# Patient Record
Sex: Male | Born: 1950 | Race: White | Hispanic: No | Marital: Married | Smoking: Former smoker
Health system: Southern US, Community
[De-identification: ages and names within clinical notes are randomized; demographics above are authoritative.]

## PROBLEM LIST (undated history)

## (undated) DIAGNOSIS — K219 Gastro-esophageal reflux disease without esophagitis: Secondary | ICD-10-CM

## (undated) DIAGNOSIS — K635 Polyp of colon: Secondary | ICD-10-CM

## (undated) DIAGNOSIS — M8949 Other hypertrophic osteoarthropathy, multiple sites: Secondary | ICD-10-CM

## (undated) DIAGNOSIS — M150 Primary generalized (osteo)arthritis: Secondary | ICD-10-CM

## (undated) DIAGNOSIS — M159 Polyosteoarthritis, unspecified: Secondary | ICD-10-CM

## (undated) DIAGNOSIS — J301 Allergic rhinitis due to pollen: Secondary | ICD-10-CM

## (undated) DIAGNOSIS — G4730 Sleep apnea, unspecified: Secondary | ICD-10-CM

## (undated) DIAGNOSIS — E7800 Pure hypercholesterolemia, unspecified: Secondary | ICD-10-CM

## (undated) DIAGNOSIS — T7840XA Allergy, unspecified, initial encounter: Secondary | ICD-10-CM

## (undated) HISTORY — DX: Other hypertrophic osteoarthropathy, multiple sites: M89.49

## (undated) HISTORY — DX: Pure hypercholesterolemia, unspecified: E78.00

## (undated) HISTORY — DX: Polyp of colon: K63.5

## (undated) HISTORY — DX: Gastro-esophageal reflux disease without esophagitis: K21.9

## (undated) HISTORY — DX: Allergy, unspecified, initial encounter: T78.40XA

## (undated) HISTORY — DX: Sleep apnea, unspecified: G47.30

## (undated) HISTORY — PX: MEDIAL COLLATERAL LIGAMENT REPAIR, KNEE: SHX2019

## (undated) HISTORY — DX: Polyosteoarthritis, unspecified: M15.9

## (undated) HISTORY — PX: APPENDECTOMY: SHX54

## (undated) HISTORY — DX: Primary generalized (osteo)arthritis: M15.0

## (undated) HISTORY — DX: Allergic rhinitis due to pollen: J30.1

---
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
## 1998-09-08 ENCOUNTER — Ambulatory Visit: Admission: RE | Admit: 1998-09-08 | Discharge: 1998-09-08 | Payer: Self-pay | Admitting: Otolaryngology

## 2004-09-16 ENCOUNTER — Encounter: Admission: RE | Admit: 2004-09-16 | Discharge: 2004-09-16 | Payer: Self-pay | Admitting: Internal Medicine

## 2005-03-22 ENCOUNTER — Encounter: Admission: RE | Admit: 2005-03-22 | Discharge: 2005-06-20 | Payer: Self-pay | Admitting: Internal Medicine

## 2005-05-10 ENCOUNTER — Encounter: Admission: RE | Admit: 2005-05-10 | Discharge: 2005-05-10 | Payer: Self-pay | Admitting: Internal Medicine

## 2005-06-29 HISTORY — PX: OTHER SURGICAL HISTORY: SHX169

## 2007-10-23 ENCOUNTER — Encounter: Admission: RE | Admit: 2007-10-23 | Discharge: 2008-01-21 | Payer: Self-pay | Admitting: Internal Medicine

## 2009-01-09 ENCOUNTER — Ambulatory Visit (HOSPITAL_BASED_OUTPATIENT_CLINIC_OR_DEPARTMENT_OTHER): Admission: RE | Admit: 2009-01-09 | Discharge: 2009-01-09 | Payer: Self-pay | Admitting: Orthopedic Surgery

## 2009-10-15 ENCOUNTER — Ambulatory Visit (HOSPITAL_BASED_OUTPATIENT_CLINIC_OR_DEPARTMENT_OTHER): Admission: RE | Admit: 2009-10-15 | Discharge: 2009-10-15 | Payer: Self-pay | Admitting: Orthopedic Surgery

## 2009-12-11 ENCOUNTER — Encounter: Admission: RE | Admit: 2009-12-11 | Discharge: 2009-12-11 | Payer: Self-pay | Admitting: Neurological Surgery

## 2011-03-03 LAB — CULTURE, FUNGUS WITHOUT SMEAR

## 2011-03-03 LAB — KOH PREP: KOH Prep: NONE SEEN

## 2011-03-03 LAB — ANAEROBIC CULTURE

## 2011-03-03 LAB — WOUND CULTURE

## 2011-03-03 LAB — AFB CULTURE WITH SMEAR (NOT AT ARMC): Acid Fast Smear: NONE SEEN

## 2011-03-16 LAB — TISSUE CULTURE: Gram Stain: NONE SEEN

## 2011-03-16 LAB — CULTURE, FUNGUS WITHOUT SMEAR

## 2011-03-16 LAB — ANAEROBIC CULTURE: Gram Stain: NONE SEEN

## 2011-04-13 NOTE — Op Note (Signed)
Eugene Mcdaniel, Eugene Mcdaniel                ACCOUNT NO.:  [id number]   MEDICAL RECORD NO.:  [id number]          PATIENT TYPE:  AMB   LOCATION:  DSC                          FACILITY:  [facility]   PHYSICIAN:  Katy Fitch. Sypher, M.D. 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   OPERATIONS:  1. Arthrotomy, left long finger distal interphalangeal joint with      debridement of loose bodies and marginal osteophytes at base of      distal phalanx and head of middle phalanx.  2. Partial resection of radial nail plate and debridement of radial      nail wall with aerobic and anaerobic culture, left long finger      nail.   OPERATIONS:  Katy Fitch. Sypher, MD   ASSISTANT:  Marveen Reeks Dasnoit, PA-C   ANESTHESIA:  A 2% lidocaine, metacarpal head level block; left long  finger.  This was performed in the minor operating room setting.   CULTURES:  Aerobic and anaerobic culture from nail fold and nail wall of  subungual debris.   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  He was referred by Dr. Earl Gala for  evaluation and management of a chronically deformed right long  fingernail with degenerative arthritis of the distal interphalangeal  joint and signs of possible mucoid cyst drainage beneath the nail fold.   CLINICAL EXAMINATION:  On December 25, 2008, revealed signs of probable  chronic subungual and periungual infection  and a probable mucoid cyst  involving the distal interphalangeal joint.   We advised Eugene Mcdaniel to proceed with debridement of his nail wall,  partial nail plate resection, and DIP joint debridement.  At this time,  he was brought to the operating room at the [facility]   PROCEDURE:  Eugene Mcdaniel was met at the [facility] in the  minor operating room.  After informed consent, we proceeded to place a  2% lidocaine digital block at metacarpal head level.  His x-rays were  reviewed.  He has advanced degenerative change at his left long finger  DIP joint as well as the PIP and MP joints.  When anesthesia was  satisfactory, the arm and hand were prepped with Betadine soap solution  and sterilely draped with sterile towels.   The procedure commenced with removal of the radial 3 mm of the nail  plate with undermining with a Therapist, nutritional and scissors dissection.  The nail plate and debris beneath the nail fold were curetted with a  microcurette and cultures were sent for aerobic and  anaerobic growth.  There was no sign of mucoid cyst drainage beneath the nail fold nor  proximally along the radial nail wall.  This was thoroughly debrided and  dressed open.  Attention directed to the distal interphalangeal joint.  With clean instruments, lateral incisions were fashioned between the  terminal extensor tendon slip and the radial ulnar collateral ligaments.  A capsular resection was performed between the terminal extensor tendon  slip and the radial collateral ligament and likewise, the ulnar  collateral ligament.  A microcurette was used to remove the marginal  osteophytes followed by debridement of the joint and irrigation with a  blunt needle with sterile saline.  Debris was removed from the joint.  The capsular tissues were mucinous on the radial dorsal aspect of the  joint suggesting a mucoid cyst was forming.   After completion of debridement, the wounds were repaired  with simple  suture of 5-0 nylon.   Eugene Mcdaniel tolerated surgery and anesthesia well.  He was transferred to  recovery with stable signs.   He will be discharged home to the care of his family.  Our plan is to  have him return for followup evaluation in approximately 1 week with a  scheduled appointment on January 15, 2009.      Katy Fitch Sypher, M.D.  Electronically Signed     RVS/MEDQ  D:  01/23/2009  T:  01/24/2009  Job:  [id number]   cc:   Theressa Millard, M.D.

## 2015-05-09 DIAGNOSIS — M25552 Pain in left hip: Secondary | ICD-10-CM | POA: Diagnosis not present

## 2015-05-21 DIAGNOSIS — M791 Myalgia: Secondary | ICD-10-CM | POA: Diagnosis not present

## 2015-06-04 DIAGNOSIS — M47817 Spondylosis without myelopathy or radiculopathy, lumbosacral region: Secondary | ICD-10-CM | POA: Diagnosis not present

## 2015-06-04 DIAGNOSIS — M5136 Other intervertebral disc degeneration, lumbar region: Secondary | ICD-10-CM | POA: Diagnosis not present

## 2015-06-12 DIAGNOSIS — M1812 Unilateral primary osteoarthritis of first carpometacarpal joint, left hand: Secondary | ICD-10-CM | POA: Diagnosis not present

## 2015-06-17 DIAGNOSIS — M791 Myalgia: Secondary | ICD-10-CM | POA: Diagnosis not present

## 2015-06-17 DIAGNOSIS — M1612 Unilateral primary osteoarthritis, left hip: Secondary | ICD-10-CM | POA: Diagnosis not present

## 2015-06-17 DIAGNOSIS — M25552 Pain in left hip: Secondary | ICD-10-CM | POA: Diagnosis not present

## 2015-06-17 DIAGNOSIS — M4806 Spinal stenosis, lumbar region: Secondary | ICD-10-CM | POA: Diagnosis not present

## 2015-08-13 DIAGNOSIS — G4733 Obstructive sleep apnea (adult) (pediatric): Secondary | ICD-10-CM | POA: Diagnosis not present

## 2015-09-02 DIAGNOSIS — R972 Elevated prostate specific antigen [PSA]: Secondary | ICD-10-CM | POA: Diagnosis not present

## 2015-09-02 DIAGNOSIS — N138 Other obstructive and reflux uropathy: Secondary | ICD-10-CM | POA: Diagnosis not present

## 2015-09-02 DIAGNOSIS — R350 Frequency of micturition: Secondary | ICD-10-CM | POA: Diagnosis not present

## 2015-09-02 DIAGNOSIS — N401 Enlarged prostate with lower urinary tract symptoms: Secondary | ICD-10-CM | POA: Diagnosis not present

## 2015-10-21 DIAGNOSIS — M7522 Bicipital tendinitis, left shoulder: Secondary | ICD-10-CM | POA: Diagnosis not present

## 2015-10-21 DIAGNOSIS — M7581 Other shoulder lesions, right shoulder: Secondary | ICD-10-CM | POA: Insufficient documentation

## 2015-10-21 DIAGNOSIS — M76891 Other specified enthesopathies of right lower limb, excluding foot: Secondary | ICD-10-CM | POA: Insufficient documentation

## 2015-10-21 DIAGNOSIS — Z87891 Personal history of nicotine dependence: Secondary | ICD-10-CM | POA: Diagnosis not present

## 2015-10-21 DIAGNOSIS — M7582 Other shoulder lesions, left shoulder: Secondary | ICD-10-CM | POA: Diagnosis not present

## 2015-10-21 DIAGNOSIS — M7521 Bicipital tendinitis, right shoulder: Secondary | ICD-10-CM | POA: Insufficient documentation

## 2015-12-08 DIAGNOSIS — R972 Elevated prostate specific antigen [PSA]: Secondary | ICD-10-CM | POA: Diagnosis not present

## 2015-12-22 DIAGNOSIS — M9903 Segmental and somatic dysfunction of lumbar region: Secondary | ICD-10-CM | POA: Diagnosis not present

## 2015-12-22 DIAGNOSIS — M9904 Segmental and somatic dysfunction of sacral region: Secondary | ICD-10-CM | POA: Diagnosis not present

## 2015-12-22 DIAGNOSIS — M5137 Other intervertebral disc degeneration, lumbosacral region: Secondary | ICD-10-CM | POA: Diagnosis not present

## 2015-12-22 DIAGNOSIS — M5116 Intervertebral disc disorders with radiculopathy, lumbar region: Secondary | ICD-10-CM | POA: Diagnosis not present

## 2015-12-22 DIAGNOSIS — M9905 Segmental and somatic dysfunction of pelvic region: Secondary | ICD-10-CM | POA: Diagnosis not present

## 2015-12-24 DIAGNOSIS — M5116 Intervertebral disc disorders with radiculopathy, lumbar region: Secondary | ICD-10-CM | POA: Diagnosis not present

## 2015-12-24 DIAGNOSIS — M9903 Segmental and somatic dysfunction of lumbar region: Secondary | ICD-10-CM | POA: Diagnosis not present

## 2015-12-24 DIAGNOSIS — M461 Sacroiliitis, not elsewhere classified: Secondary | ICD-10-CM | POA: Diagnosis not present

## 2015-12-24 DIAGNOSIS — M5137 Other intervertebral disc degeneration, lumbosacral region: Secondary | ICD-10-CM | POA: Diagnosis not present

## 2015-12-24 DIAGNOSIS — M9905 Segmental and somatic dysfunction of pelvic region: Secondary | ICD-10-CM | POA: Diagnosis not present

## 2015-12-24 DIAGNOSIS — M9904 Segmental and somatic dysfunction of sacral region: Secondary | ICD-10-CM | POA: Diagnosis not present

## 2015-12-26 DIAGNOSIS — M9904 Segmental and somatic dysfunction of sacral region: Secondary | ICD-10-CM | POA: Diagnosis not present

## 2015-12-26 DIAGNOSIS — M9905 Segmental and somatic dysfunction of pelvic region: Secondary | ICD-10-CM | POA: Diagnosis not present

## 2015-12-26 DIAGNOSIS — M9903 Segmental and somatic dysfunction of lumbar region: Secondary | ICD-10-CM | POA: Diagnosis not present

## 2015-12-26 DIAGNOSIS — M5137 Other intervertebral disc degeneration, lumbosacral region: Secondary | ICD-10-CM | POA: Diagnosis not present

## 2015-12-26 DIAGNOSIS — M5116 Intervertebral disc disorders with radiculopathy, lumbar region: Secondary | ICD-10-CM | POA: Diagnosis not present

## 2015-12-29 DIAGNOSIS — M9905 Segmental and somatic dysfunction of pelvic region: Secondary | ICD-10-CM | POA: Diagnosis not present

## 2015-12-29 DIAGNOSIS — M9903 Segmental and somatic dysfunction of lumbar region: Secondary | ICD-10-CM | POA: Diagnosis not present

## 2015-12-29 DIAGNOSIS — M461 Sacroiliitis, not elsewhere classified: Secondary | ICD-10-CM | POA: Diagnosis not present

## 2015-12-29 DIAGNOSIS — M9904 Segmental and somatic dysfunction of sacral region: Secondary | ICD-10-CM | POA: Diagnosis not present

## 2015-12-29 DIAGNOSIS — M5116 Intervertebral disc disorders with radiculopathy, lumbar region: Secondary | ICD-10-CM | POA: Diagnosis not present

## 2015-12-29 DIAGNOSIS — M5137 Other intervertebral disc degeneration, lumbosacral region: Secondary | ICD-10-CM | POA: Diagnosis not present

## 2015-12-31 DIAGNOSIS — M9904 Segmental and somatic dysfunction of sacral region: Secondary | ICD-10-CM | POA: Diagnosis not present

## 2015-12-31 DIAGNOSIS — M9905 Segmental and somatic dysfunction of pelvic region: Secondary | ICD-10-CM | POA: Diagnosis not present

## 2015-12-31 DIAGNOSIS — M5137 Other intervertebral disc degeneration, lumbosacral region: Secondary | ICD-10-CM | POA: Diagnosis not present

## 2015-12-31 DIAGNOSIS — M9903 Segmental and somatic dysfunction of lumbar region: Secondary | ICD-10-CM | POA: Diagnosis not present

## 2015-12-31 DIAGNOSIS — M5116 Intervertebral disc disorders with radiculopathy, lumbar region: Secondary | ICD-10-CM | POA: Diagnosis not present

## 2015-12-31 DIAGNOSIS — M461 Sacroiliitis, not elsewhere classified: Secondary | ICD-10-CM | POA: Diagnosis not present

## 2016-01-02 DIAGNOSIS — M9903 Segmental and somatic dysfunction of lumbar region: Secondary | ICD-10-CM | POA: Diagnosis not present

## 2016-01-02 DIAGNOSIS — M5116 Intervertebral disc disorders with radiculopathy, lumbar region: Secondary | ICD-10-CM | POA: Diagnosis not present

## 2016-01-02 DIAGNOSIS — M9905 Segmental and somatic dysfunction of pelvic region: Secondary | ICD-10-CM | POA: Diagnosis not present

## 2016-01-02 DIAGNOSIS — M5137 Other intervertebral disc degeneration, lumbosacral region: Secondary | ICD-10-CM | POA: Diagnosis not present

## 2016-01-02 DIAGNOSIS — M9904 Segmental and somatic dysfunction of sacral region: Secondary | ICD-10-CM | POA: Diagnosis not present

## 2016-01-02 DIAGNOSIS — M461 Sacroiliitis, not elsewhere classified: Secondary | ICD-10-CM | POA: Diagnosis not present

## 2016-01-05 DIAGNOSIS — M461 Sacroiliitis, not elsewhere classified: Secondary | ICD-10-CM | POA: Diagnosis not present

## 2016-01-05 DIAGNOSIS — M9904 Segmental and somatic dysfunction of sacral region: Secondary | ICD-10-CM | POA: Diagnosis not present

## 2016-01-05 DIAGNOSIS — M9905 Segmental and somatic dysfunction of pelvic region: Secondary | ICD-10-CM | POA: Diagnosis not present

## 2016-01-05 DIAGNOSIS — M5116 Intervertebral disc disorders with radiculopathy, lumbar region: Secondary | ICD-10-CM | POA: Diagnosis not present

## 2016-01-05 DIAGNOSIS — M5137 Other intervertebral disc degeneration, lumbosacral region: Secondary | ICD-10-CM | POA: Diagnosis not present

## 2016-01-05 DIAGNOSIS — M9903 Segmental and somatic dysfunction of lumbar region: Secondary | ICD-10-CM | POA: Diagnosis not present

## 2016-01-07 DIAGNOSIS — M9905 Segmental and somatic dysfunction of pelvic region: Secondary | ICD-10-CM | POA: Diagnosis not present

## 2016-01-07 DIAGNOSIS — M5116 Intervertebral disc disorders with radiculopathy, lumbar region: Secondary | ICD-10-CM | POA: Diagnosis not present

## 2016-01-07 DIAGNOSIS — M5137 Other intervertebral disc degeneration, lumbosacral region: Secondary | ICD-10-CM | POA: Diagnosis not present

## 2016-01-07 DIAGNOSIS — M461 Sacroiliitis, not elsewhere classified: Secondary | ICD-10-CM | POA: Diagnosis not present

## 2016-01-07 DIAGNOSIS — M9904 Segmental and somatic dysfunction of sacral region: Secondary | ICD-10-CM | POA: Diagnosis not present

## 2016-01-07 DIAGNOSIS — M9903 Segmental and somatic dysfunction of lumbar region: Secondary | ICD-10-CM | POA: Diagnosis not present

## 2016-01-09 DIAGNOSIS — M9903 Segmental and somatic dysfunction of lumbar region: Secondary | ICD-10-CM | POA: Diagnosis not present

## 2016-01-09 DIAGNOSIS — M5116 Intervertebral disc disorders with radiculopathy, lumbar region: Secondary | ICD-10-CM | POA: Diagnosis not present

## 2016-01-09 DIAGNOSIS — M9905 Segmental and somatic dysfunction of pelvic region: Secondary | ICD-10-CM | POA: Diagnosis not present

## 2016-01-09 DIAGNOSIS — M5137 Other intervertebral disc degeneration, lumbosacral region: Secondary | ICD-10-CM | POA: Diagnosis not present

## 2016-01-09 DIAGNOSIS — M461 Sacroiliitis, not elsewhere classified: Secondary | ICD-10-CM | POA: Diagnosis not present

## 2016-01-09 DIAGNOSIS — M9904 Segmental and somatic dysfunction of sacral region: Secondary | ICD-10-CM | POA: Diagnosis not present

## 2016-01-16 DIAGNOSIS — M461 Sacroiliitis, not elsewhere classified: Secondary | ICD-10-CM | POA: Diagnosis not present

## 2016-01-16 DIAGNOSIS — M5137 Other intervertebral disc degeneration, lumbosacral region: Secondary | ICD-10-CM | POA: Diagnosis not present

## 2016-01-16 DIAGNOSIS — M5116 Intervertebral disc disorders with radiculopathy, lumbar region: Secondary | ICD-10-CM | POA: Diagnosis not present

## 2016-01-16 DIAGNOSIS — M9904 Segmental and somatic dysfunction of sacral region: Secondary | ICD-10-CM | POA: Diagnosis not present

## 2016-01-16 DIAGNOSIS — M9903 Segmental and somatic dysfunction of lumbar region: Secondary | ICD-10-CM | POA: Diagnosis not present

## 2016-01-16 DIAGNOSIS — M9905 Segmental and somatic dysfunction of pelvic region: Secondary | ICD-10-CM | POA: Diagnosis not present

## 2016-01-20 DIAGNOSIS — M9903 Segmental and somatic dysfunction of lumbar region: Secondary | ICD-10-CM | POA: Diagnosis not present

## 2016-01-20 DIAGNOSIS — M9905 Segmental and somatic dysfunction of pelvic region: Secondary | ICD-10-CM | POA: Diagnosis not present

## 2016-01-20 DIAGNOSIS — M5116 Intervertebral disc disorders with radiculopathy, lumbar region: Secondary | ICD-10-CM | POA: Diagnosis not present

## 2016-01-20 DIAGNOSIS — M9904 Segmental and somatic dysfunction of sacral region: Secondary | ICD-10-CM | POA: Diagnosis not present

## 2016-01-20 DIAGNOSIS — M5137 Other intervertebral disc degeneration, lumbosacral region: Secondary | ICD-10-CM | POA: Diagnosis not present

## 2016-01-20 DIAGNOSIS — M461 Sacroiliitis, not elsewhere classified: Secondary | ICD-10-CM | POA: Diagnosis not present

## 2016-01-23 DIAGNOSIS — M9903 Segmental and somatic dysfunction of lumbar region: Secondary | ICD-10-CM | POA: Diagnosis not present

## 2016-01-23 DIAGNOSIS — M5137 Other intervertebral disc degeneration, lumbosacral region: Secondary | ICD-10-CM | POA: Diagnosis not present

## 2016-01-23 DIAGNOSIS — M9905 Segmental and somatic dysfunction of pelvic region: Secondary | ICD-10-CM | POA: Diagnosis not present

## 2016-01-23 DIAGNOSIS — M461 Sacroiliitis, not elsewhere classified: Secondary | ICD-10-CM | POA: Diagnosis not present

## 2016-01-23 DIAGNOSIS — M5116 Intervertebral disc disorders with radiculopathy, lumbar region: Secondary | ICD-10-CM | POA: Diagnosis not present

## 2016-01-23 DIAGNOSIS — M9904 Segmental and somatic dysfunction of sacral region: Secondary | ICD-10-CM | POA: Diagnosis not present

## 2016-01-27 DIAGNOSIS — D485 Neoplasm of uncertain behavior of skin: Secondary | ICD-10-CM | POA: Diagnosis not present

## 2016-01-27 DIAGNOSIS — D2372 Other benign neoplasm of skin of left lower limb, including hip: Secondary | ICD-10-CM | POA: Diagnosis not present

## 2016-01-27 DIAGNOSIS — Z85828 Personal history of other malignant neoplasm of skin: Secondary | ICD-10-CM | POA: Diagnosis not present

## 2016-01-27 DIAGNOSIS — L814 Other melanin hyperpigmentation: Secondary | ICD-10-CM | POA: Diagnosis not present

## 2016-01-27 DIAGNOSIS — D225 Melanocytic nevi of trunk: Secondary | ICD-10-CM | POA: Diagnosis not present

## 2016-01-27 DIAGNOSIS — D2362 Other benign neoplasm of skin of left upper limb, including shoulder: Secondary | ICD-10-CM | POA: Diagnosis not present

## 2016-01-27 DIAGNOSIS — D1801 Hemangioma of skin and subcutaneous tissue: Secondary | ICD-10-CM | POA: Diagnosis not present

## 2016-01-27 DIAGNOSIS — D2272 Melanocytic nevi of left lower limb, including hip: Secondary | ICD-10-CM | POA: Diagnosis not present

## 2016-01-27 DIAGNOSIS — D171 Benign lipomatous neoplasm of skin and subcutaneous tissue of trunk: Secondary | ICD-10-CM | POA: Diagnosis not present

## 2016-01-27 DIAGNOSIS — L821 Other seborrheic keratosis: Secondary | ICD-10-CM | POA: Diagnosis not present

## 2016-01-27 DIAGNOSIS — L570 Actinic keratosis: Secondary | ICD-10-CM | POA: Diagnosis not present

## 2016-01-30 DIAGNOSIS — M461 Sacroiliitis, not elsewhere classified: Secondary | ICD-10-CM | POA: Diagnosis not present

## 2016-01-30 DIAGNOSIS — M5116 Intervertebral disc disorders with radiculopathy, lumbar region: Secondary | ICD-10-CM | POA: Diagnosis not present

## 2016-01-30 DIAGNOSIS — M9905 Segmental and somatic dysfunction of pelvic region: Secondary | ICD-10-CM | POA: Diagnosis not present

## 2016-01-30 DIAGNOSIS — M9904 Segmental and somatic dysfunction of sacral region: Secondary | ICD-10-CM | POA: Diagnosis not present

## 2016-01-30 DIAGNOSIS — M9903 Segmental and somatic dysfunction of lumbar region: Secondary | ICD-10-CM | POA: Diagnosis not present

## 2016-01-30 DIAGNOSIS — M5137 Other intervertebral disc degeneration, lumbosacral region: Secondary | ICD-10-CM | POA: Diagnosis not present

## 2016-02-13 DIAGNOSIS — M9903 Segmental and somatic dysfunction of lumbar region: Secondary | ICD-10-CM | POA: Diagnosis not present

## 2016-02-13 DIAGNOSIS — M9905 Segmental and somatic dysfunction of pelvic region: Secondary | ICD-10-CM | POA: Diagnosis not present

## 2016-02-13 DIAGNOSIS — M5116 Intervertebral disc disorders with radiculopathy, lumbar region: Secondary | ICD-10-CM | POA: Diagnosis not present

## 2016-02-13 DIAGNOSIS — M9904 Segmental and somatic dysfunction of sacral region: Secondary | ICD-10-CM | POA: Diagnosis not present

## 2016-02-13 DIAGNOSIS — M5137 Other intervertebral disc degeneration, lumbosacral region: Secondary | ICD-10-CM | POA: Diagnosis not present

## 2016-02-13 DIAGNOSIS — M461 Sacroiliitis, not elsewhere classified: Secondary | ICD-10-CM | POA: Diagnosis not present

## 2016-02-27 DIAGNOSIS — M5116 Intervertebral disc disorders with radiculopathy, lumbar region: Secondary | ICD-10-CM | POA: Diagnosis not present

## 2016-02-27 DIAGNOSIS — M9904 Segmental and somatic dysfunction of sacral region: Secondary | ICD-10-CM | POA: Diagnosis not present

## 2016-02-27 DIAGNOSIS — M5137 Other intervertebral disc degeneration, lumbosacral region: Secondary | ICD-10-CM | POA: Diagnosis not present

## 2016-02-27 DIAGNOSIS — M461 Sacroiliitis, not elsewhere classified: Secondary | ICD-10-CM | POA: Diagnosis not present

## 2016-02-27 DIAGNOSIS — M9905 Segmental and somatic dysfunction of pelvic region: Secondary | ICD-10-CM | POA: Diagnosis not present

## 2016-02-27 DIAGNOSIS — M9903 Segmental and somatic dysfunction of lumbar region: Secondary | ICD-10-CM | POA: Diagnosis not present

## 2016-03-19 DIAGNOSIS — M5137 Other intervertebral disc degeneration, lumbosacral region: Secondary | ICD-10-CM | POA: Diagnosis not present

## 2016-03-19 DIAGNOSIS — M9905 Segmental and somatic dysfunction of pelvic region: Secondary | ICD-10-CM | POA: Diagnosis not present

## 2016-03-19 DIAGNOSIS — M461 Sacroiliitis, not elsewhere classified: Secondary | ICD-10-CM | POA: Diagnosis not present

## 2016-03-19 DIAGNOSIS — M5116 Intervertebral disc disorders with radiculopathy, lumbar region: Secondary | ICD-10-CM | POA: Diagnosis not present

## 2016-03-19 DIAGNOSIS — M9903 Segmental and somatic dysfunction of lumbar region: Secondary | ICD-10-CM | POA: Diagnosis not present

## 2016-03-19 DIAGNOSIS — M9904 Segmental and somatic dysfunction of sacral region: Secondary | ICD-10-CM | POA: Diagnosis not present

## 2016-04-09 DIAGNOSIS — M9903 Segmental and somatic dysfunction of lumbar region: Secondary | ICD-10-CM | POA: Diagnosis not present

## 2016-04-09 DIAGNOSIS — M5116 Intervertebral disc disorders with radiculopathy, lumbar region: Secondary | ICD-10-CM | POA: Diagnosis not present

## 2016-04-09 DIAGNOSIS — M5137 Other intervertebral disc degeneration, lumbosacral region: Secondary | ICD-10-CM | POA: Diagnosis not present

## 2016-04-09 DIAGNOSIS — M9904 Segmental and somatic dysfunction of sacral region: Secondary | ICD-10-CM | POA: Diagnosis not present

## 2016-04-09 DIAGNOSIS — M461 Sacroiliitis, not elsewhere classified: Secondary | ICD-10-CM | POA: Diagnosis not present

## 2016-04-09 DIAGNOSIS — M9905 Segmental and somatic dysfunction of pelvic region: Secondary | ICD-10-CM | POA: Diagnosis not present

## 2016-05-14 DIAGNOSIS — M9903 Segmental and somatic dysfunction of lumbar region: Secondary | ICD-10-CM | POA: Diagnosis not present

## 2016-05-14 DIAGNOSIS — M5137 Other intervertebral disc degeneration, lumbosacral region: Secondary | ICD-10-CM | POA: Diagnosis not present

## 2016-05-14 DIAGNOSIS — M461 Sacroiliitis, not elsewhere classified: Secondary | ICD-10-CM | POA: Diagnosis not present

## 2016-05-14 DIAGNOSIS — M9904 Segmental and somatic dysfunction of sacral region: Secondary | ICD-10-CM | POA: Diagnosis not present

## 2016-05-14 DIAGNOSIS — M5116 Intervertebral disc disorders with radiculopathy, lumbar region: Secondary | ICD-10-CM | POA: Diagnosis not present

## 2016-05-14 DIAGNOSIS — M9905 Segmental and somatic dysfunction of pelvic region: Secondary | ICD-10-CM | POA: Diagnosis not present

## 2016-06-11 DIAGNOSIS — M461 Sacroiliitis, not elsewhere classified: Secondary | ICD-10-CM | POA: Diagnosis not present

## 2016-06-11 DIAGNOSIS — M9905 Segmental and somatic dysfunction of pelvic region: Secondary | ICD-10-CM | POA: Diagnosis not present

## 2016-06-11 DIAGNOSIS — M5116 Intervertebral disc disorders with radiculopathy, lumbar region: Secondary | ICD-10-CM | POA: Diagnosis not present

## 2016-06-11 DIAGNOSIS — M9903 Segmental and somatic dysfunction of lumbar region: Secondary | ICD-10-CM | POA: Diagnosis not present

## 2016-06-11 DIAGNOSIS — M9904 Segmental and somatic dysfunction of sacral region: Secondary | ICD-10-CM | POA: Diagnosis not present

## 2016-06-11 DIAGNOSIS — M5137 Other intervertebral disc degeneration, lumbosacral region: Secondary | ICD-10-CM | POA: Diagnosis not present

## 2016-06-17 DIAGNOSIS — H01004 Unspecified blepharitis left upper eyelid: Secondary | ICD-10-CM | POA: Diagnosis not present

## 2016-06-17 DIAGNOSIS — H5203 Hypermetropia, bilateral: Secondary | ICD-10-CM | POA: Diagnosis not present

## 2016-07-09 DIAGNOSIS — M9905 Segmental and somatic dysfunction of pelvic region: Secondary | ICD-10-CM | POA: Diagnosis not present

## 2016-07-09 DIAGNOSIS — M9903 Segmental and somatic dysfunction of lumbar region: Secondary | ICD-10-CM | POA: Diagnosis not present

## 2016-07-09 DIAGNOSIS — M9904 Segmental and somatic dysfunction of sacral region: Secondary | ICD-10-CM | POA: Diagnosis not present

## 2016-07-09 DIAGNOSIS — M461 Sacroiliitis, not elsewhere classified: Secondary | ICD-10-CM | POA: Diagnosis not present

## 2016-07-09 DIAGNOSIS — M5116 Intervertebral disc disorders with radiculopathy, lumbar region: Secondary | ICD-10-CM | POA: Diagnosis not present

## 2016-07-09 DIAGNOSIS — M5137 Other intervertebral disc degeneration, lumbosacral region: Secondary | ICD-10-CM | POA: Diagnosis not present

## 2016-10-11 DIAGNOSIS — G4733 Obstructive sleep apnea (adult) (pediatric): Secondary | ICD-10-CM | POA: Diagnosis not present

## 2016-11-12 DIAGNOSIS — M9904 Segmental and somatic dysfunction of sacral region: Secondary | ICD-10-CM | POA: Diagnosis not present

## 2016-11-12 DIAGNOSIS — M461 Sacroiliitis, not elsewhere classified: Secondary | ICD-10-CM | POA: Diagnosis not present

## 2016-11-12 DIAGNOSIS — M9903 Segmental and somatic dysfunction of lumbar region: Secondary | ICD-10-CM | POA: Diagnosis not present

## 2016-11-12 DIAGNOSIS — M5116 Intervertebral disc disorders with radiculopathy, lumbar region: Secondary | ICD-10-CM | POA: Diagnosis not present

## 2016-11-12 DIAGNOSIS — M5137 Other intervertebral disc degeneration, lumbosacral region: Secondary | ICD-10-CM | POA: Diagnosis not present

## 2016-11-12 DIAGNOSIS — M9905 Segmental and somatic dysfunction of pelvic region: Secondary | ICD-10-CM | POA: Diagnosis not present

## 2016-12-10 DIAGNOSIS — M461 Sacroiliitis, not elsewhere classified: Secondary | ICD-10-CM | POA: Diagnosis not present

## 2016-12-10 DIAGNOSIS — M9903 Segmental and somatic dysfunction of lumbar region: Secondary | ICD-10-CM | POA: Diagnosis not present

## 2016-12-10 DIAGNOSIS — M9904 Segmental and somatic dysfunction of sacral region: Secondary | ICD-10-CM | POA: Diagnosis not present

## 2016-12-10 DIAGNOSIS — M9905 Segmental and somatic dysfunction of pelvic region: Secondary | ICD-10-CM | POA: Diagnosis not present

## 2016-12-10 DIAGNOSIS — M5116 Intervertebral disc disorders with radiculopathy, lumbar region: Secondary | ICD-10-CM | POA: Diagnosis not present

## 2016-12-10 DIAGNOSIS — M5137 Other intervertebral disc degeneration, lumbosacral region: Secondary | ICD-10-CM | POA: Diagnosis not present

## 2017-01-07 DIAGNOSIS — R972 Elevated prostate specific antigen [PSA]: Secondary | ICD-10-CM | POA: Diagnosis not present

## 2017-01-07 DIAGNOSIS — M9904 Segmental and somatic dysfunction of sacral region: Secondary | ICD-10-CM | POA: Diagnosis not present

## 2017-01-07 DIAGNOSIS — M5137 Other intervertebral disc degeneration, lumbosacral region: Secondary | ICD-10-CM | POA: Diagnosis not present

## 2017-01-07 DIAGNOSIS — M5116 Intervertebral disc disorders with radiculopathy, lumbar region: Secondary | ICD-10-CM | POA: Diagnosis not present

## 2017-01-07 DIAGNOSIS — M461 Sacroiliitis, not elsewhere classified: Secondary | ICD-10-CM | POA: Diagnosis not present

## 2017-01-07 DIAGNOSIS — M9903 Segmental and somatic dysfunction of lumbar region: Secondary | ICD-10-CM | POA: Diagnosis not present

## 2017-01-07 DIAGNOSIS — M9905 Segmental and somatic dysfunction of pelvic region: Secondary | ICD-10-CM | POA: Diagnosis not present

## 2017-01-10 DIAGNOSIS — R351 Nocturia: Secondary | ICD-10-CM | POA: Diagnosis not present

## 2017-01-10 DIAGNOSIS — N401 Enlarged prostate with lower urinary tract symptoms: Secondary | ICD-10-CM | POA: Diagnosis not present

## 2017-01-10 DIAGNOSIS — R972 Elevated prostate specific antigen [PSA]: Secondary | ICD-10-CM | POA: Diagnosis not present

## 2017-02-15 DIAGNOSIS — L603 Nail dystrophy: Secondary | ICD-10-CM | POA: Diagnosis not present

## 2017-02-15 DIAGNOSIS — L821 Other seborrheic keratosis: Secondary | ICD-10-CM | POA: Diagnosis not present

## 2017-02-15 DIAGNOSIS — D2272 Melanocytic nevi of left lower limb, including hip: Secondary | ICD-10-CM | POA: Diagnosis not present

## 2017-02-15 DIAGNOSIS — Z85828 Personal history of other malignant neoplasm of skin: Secondary | ICD-10-CM | POA: Diagnosis not present

## 2017-02-15 DIAGNOSIS — L720 Epidermal cyst: Secondary | ICD-10-CM | POA: Diagnosis not present

## 2017-02-15 DIAGNOSIS — L570 Actinic keratosis: Secondary | ICD-10-CM | POA: Diagnosis not present

## 2017-02-15 DIAGNOSIS — D1801 Hemangioma of skin and subcutaneous tissue: Secondary | ICD-10-CM | POA: Diagnosis not present

## 2017-03-28 DIAGNOSIS — M150 Primary generalized (osteo)arthritis: Secondary | ICD-10-CM

## 2017-03-28 DIAGNOSIS — N138 Other obstructive and reflux uropathy: Secondary | ICD-10-CM | POA: Diagnosis not present

## 2017-03-28 DIAGNOSIS — E7800 Pure hypercholesterolemia, unspecified: Secondary | ICD-10-CM | POA: Diagnosis not present

## 2017-03-28 DIAGNOSIS — N401 Enlarged prostate with lower urinary tract symptoms: Secondary | ICD-10-CM

## 2017-03-28 DIAGNOSIS — J301 Allergic rhinitis due to pollen: Secondary | ICD-10-CM | POA: Diagnosis not present

## 2017-03-28 DIAGNOSIS — E7849 Other hyperlipidemia: Secondary | ICD-10-CM | POA: Insufficient documentation

## 2017-03-28 DIAGNOSIS — M159 Polyosteoarthritis, unspecified: Secondary | ICD-10-CM | POA: Insufficient documentation

## 2017-03-28 DIAGNOSIS — K219 Gastro-esophageal reflux disease without esophagitis: Secondary | ICD-10-CM | POA: Insufficient documentation

## 2017-04-28 DIAGNOSIS — Z1211 Encounter for screening for malignant neoplasm of colon: Secondary | ICD-10-CM | POA: Diagnosis not present

## 2017-04-28 DIAGNOSIS — E784 Other hyperlipidemia: Secondary | ICD-10-CM | POA: Diagnosis not present

## 2017-04-28 DIAGNOSIS — K219 Gastro-esophageal reflux disease without esophagitis: Secondary | ICD-10-CM | POA: Diagnosis not present

## 2017-06-13 DIAGNOSIS — R1013 Epigastric pain: Secondary | ICD-10-CM | POA: Insufficient documentation

## 2017-06-13 DIAGNOSIS — Z1211 Encounter for screening for malignant neoplasm of colon: Secondary | ICD-10-CM | POA: Insufficient documentation

## 2017-06-15 DIAGNOSIS — K219 Gastro-esophageal reflux disease without esophagitis: Secondary | ICD-10-CM | POA: Diagnosis not present

## 2017-06-15 DIAGNOSIS — Z1211 Encounter for screening for malignant neoplasm of colon: Secondary | ICD-10-CM | POA: Diagnosis not present

## 2017-06-15 DIAGNOSIS — K5900 Constipation, unspecified: Secondary | ICD-10-CM | POA: Diagnosis not present

## 2017-09-09 DIAGNOSIS — K635 Polyp of colon: Secondary | ICD-10-CM | POA: Diagnosis not present

## 2017-09-09 DIAGNOSIS — Z1211 Encounter for screening for malignant neoplasm of colon: Secondary | ICD-10-CM | POA: Diagnosis not present

## 2017-09-09 DIAGNOSIS — K219 Gastro-esophageal reflux disease without esophagitis: Secondary | ICD-10-CM | POA: Diagnosis not present

## 2017-09-09 DIAGNOSIS — D125 Benign neoplasm of sigmoid colon: Secondary | ICD-10-CM | POA: Diagnosis not present

## 2017-09-09 LAB — HM COLONOSCOPY

## 2017-12-19 DIAGNOSIS — G4733 Obstructive sleep apnea (adult) (pediatric): Secondary | ICD-10-CM | POA: Diagnosis not present

## 2017-12-26 ENCOUNTER — Encounter: Payer: Self-pay | Admitting: *Deleted

## 2017-12-27 ENCOUNTER — Other Ambulatory Visit: Payer: Self-pay

## 2017-12-27 ENCOUNTER — Ambulatory Visit (INDEPENDENT_AMBULATORY_CARE_PROVIDER_SITE_OTHER): Payer: Medicare Other | Admitting: Family Medicine

## 2017-12-27 ENCOUNTER — Encounter: Payer: Self-pay | Admitting: Family Medicine

## 2017-12-27 VITALS — BP 128/82 | HR 59 | Temp 97.6°F | Resp 18 | Ht 70.0 in | Wt 191.6 lb

## 2017-12-27 DIAGNOSIS — J301 Allergic rhinitis due to pollen: Secondary | ICD-10-CM

## 2017-12-27 DIAGNOSIS — R1013 Epigastric pain: Secondary | ICD-10-CM | POA: Diagnosis not present

## 2017-12-27 DIAGNOSIS — N138 Other obstructive and reflux uropathy: Secondary | ICD-10-CM

## 2017-12-27 DIAGNOSIS — E7849 Other hyperlipidemia: Secondary | ICD-10-CM

## 2017-12-27 DIAGNOSIS — Z5320 Procedure and treatment not carried out because of patient's decision for unspecified reasons: Secondary | ICD-10-CM | POA: Insufficient documentation

## 2017-12-27 DIAGNOSIS — N401 Enlarged prostate with lower urinary tract symptoms: Secondary | ICD-10-CM

## 2017-12-27 DIAGNOSIS — Z5329 Procedure and treatment not carried out because of patient's decision for other reasons: Secondary | ICD-10-CM | POA: Diagnosis not present

## 2017-12-27 DIAGNOSIS — K219 Gastro-esophageal reflux disease without esophagitis: Secondary | ICD-10-CM | POA: Diagnosis not present

## 2017-12-27 NOTE — Patient Instructions (Addendum)
It was so good seeing you again! Thank you for establishing with my new practice and allowing me to continue caring for you. It means a lot to me.   Please schedule a follow up appointment with me in 1-4 months for your complete physical.

## 2017-12-27 NOTE — Progress Notes (Signed)
Subjective  CC:  Chief Complaint  Patient presents with  . Establish Care    HPI: Eugene Mcdaniel is a 67 y.o. male who presents to [place] at [facility] today to establish care with me as a new patient. He is a former [facility] patient and is here to reestablish care with me today.   He has the following concerns or needs:   Hypercholesterolemia: reviewed records, high LDL 172 and familial; eats well and is active. No premature CAD in family. Patient does not believe high cholesterol correlates to cardiovascular risks. Declines further treatment. Had myalgias with statins.   Chronic dyspepsia/constipation: s/p egd and colonoscopy in October. Records not available for review but pt reports normal. Feeling well on bid PPI. Alcohol nightly could be aggravating factor. Negative CAGE  bph - on alpha blocker with relief. Has f/u with Dr. Jeffie Pollock soon. No new problems.   AR - perennial on meds - reports good control  HM - due for CPE and discussed vaccination recommendations today. Lifestyle is active; diet is healthy  We updated and reviewed the patient's past history in detail and it is documented below.  Patient Active Problem List   Diagnosis Date Noted  . Dyspepsia 06/13/2017    Priority: High  . Gastroesophageal reflux disease without esophagitis 03/28/2017    Priority: High  . Familial hyperlipidemia 03/28/2017    Priority: High  . Constipation 06/15/2017    Priority: Medium  . BPH with urinary obstruction 03/28/2017    Priority: Medium  . Primary osteoarthritis involving multiple joints 03/28/2017    Priority: Medium  . Seasonal allergic rhinitis due to pollen 03/28/2017    Priority: Low  . Tendinitis of both rotator cuffs 10/21/2015    Priority: Low  . Refusal of statin medication by patient 12/27/2017   Health Maintenance  Topic Date Due  . Hepatitis C Screening  Oct 11, 1951  . PNA vac Low Risk Adult (1 of 2 - PCV13) 05/13/2015  . TETANUS/TDAP   02/27/2026  . COLONOSCOPY  08/30/2027  . INFLUENZA VACCINE  Discontinued   Immunization History  Administered Date(s) Administered  . Tdap 02/28/2016   Current Meds  Medication Sig  . Ascorbic Acid (VITAMIN C CR) 500 MG CPCR Take by mouth.  Marland Kitchen BIOTIN PO Take by mouth.  . Cholecalciferol (VITAMIN D3) 2000 units TABS Take by mouth.  . doxazosin (CARDURA) 4 MG tablet Take by mouth.  . fluticasone (FLONASE) 50 MCG/ACT nasal spray 1 spray by Both Nostrils route daily.  Marland Kitchen levocetirizine (XYZAL) 5 MG tablet Take 5 mg by mouth every evening.  Marland Kitchen MAGNESIUM CITRATE PO Take by mouth.  . omega-3 acid ethyl esters (LOVAZA) 1 g capsule Take by mouth.  Marland Kitchen omeprazole (PRILOSEC OTC) 20 MG tablet Take by mouth.  Marland Kitchen PROBIOTIC PRODUCT PO Take by mouth.  . vitamin B-12 (CYANOCOBALAMIN) 1000 MCG tablet Take by mouth.  Marland Kitchen VITAMIN K PO Take by mouth.    Allergies: Patient is allergic to atorvastatin and simvastatin. Past Medical History Patient  has a past medical history of Allergy, Colon polyp, GERD (gastroesophageal reflux disease), Hypercholesterolemia, Primary osteoarthritis involving multiple joints, Seasonal allergic rhinitis due to pollen, and Sleep apnea. Past Surgical History Patient  has a past surgical history that includes Appendectomy; colonoscopy  (06/2005); Tonsillectomy and adenoidectomy (1962); and Medial collateral ligament repair, knee (Right). Family History: Patient family history includes Arthritis in his father; Healthy in his daughter and sister; Heart disease in his father; Lung cancer in  his mother. Social History:  Patient  reports that he quit smoking about 31 years ago. His smoking use included cigarettes. He has a 60.00 pack-year smoking history. He has quit using smokeless tobacco. He reports that he drinks alcohol. He reports that he does not use drugs.  Review of Systems: Constitutional: negative for fever or malaise Ophthalmic: negative for photophobia, double vision or  loss of vision Cardiovascular: negative for chest pain, dyspnea on exertion, or new LE swelling Respiratory: negative for SOB or persistent cough Gastrointestinal: negative for abdominal pain, change in bowel habits or melena Genitourinary: negative for dysuria or gross hematuria Musculoskeletal: negative for new gait disturbance or muscular weakness Integumentary: negative for new or persistent rashes Neurological: negative for TIA or stroke symptoms Psychiatric: negative for SI or delusions Allergic/Immunologic: negative for hives  Patient Care Team    Relationship Specialty Notifications Start End  Leamon Arnt, MD PCP - General Family Medicine  12/27/17   Irine Seal, MD Attending Physician Urology  12/27/17     Objective  Vitals: BP 128/82   Pulse (!) 59   Temp 97.6 F (36.4 C)   Resp 18   Ht 5\' 10"  (1.778 m)   Wt 191 lb 9.6 oz (86.9 kg)   SpO2 92%   BMI 27.49 kg/m  General:  Well developed, well nourished, no acute distress  Psych:  Alert and oriented,normal mood and affect HEENT:  Normocephalic, atraumatic, non-icteric sclera,  MSK: no deformities, contusions. Joints are without erythema or swelling Skin:  Warm, no rashes or suspicious lesions noted Neurologic:    Mental status is normal. Gross motor and sensory exams are normal. Normal gait  Assessment  1. Dyspepsia   2. Familial hyperlipidemia   3. Gastroesophageal reflux disease without esophagitis   4. Refusal of statin medication by patient   5. BPH with urinary obstruction   6. Seasonal allergic rhinitis due to pollen      Plan   Chronic dyspepsia - stable on meds. Discussed daily alcohol use; neg abuse or problems. Can aggravate dyspepsia  Hyperlipidemia - discussed data associating high LDL with CAD. Pt disagrees. No further treatment at this time   bph on meds - f/u with urology  AR is controlled.   HM - declines flu shot; educated on shingrix, pneumovac and prevnar. Pt will research and discuss  further at next CPE.   Follow up:  Complete physical with labs.  Commons side effects, risks, benefits, and alternatives for medications and treatment plan prescribed today were discussed, and the patient expressed understanding of the given instructions. Patient is instructed to call or message via MyChart if he/she has any questions or concerns regarding our treatment plan. No barriers to understanding were identified. We discussed Red Flag symptoms and signs in detail. Patient expressed understanding regarding what to do in case of urgent or emergency type symptoms.   Medication list was reconciled, printed and provided to the patient in AVS. Patient instructions and summary information was reviewed with the patient as documented in the AVS. This note was prepared with assistance of Dragon voice recognition software. Occasional wrong-word or sound-a-like substitutions may have occurred due to the inherent limitations of voice recognition software

## 2017-12-28 ENCOUNTER — Encounter: Payer: Self-pay | Admitting: Family Medicine

## 2017-12-28 DIAGNOSIS — K635 Polyp of colon: Secondary | ICD-10-CM

## 2017-12-28 HISTORY — DX: Polyp of colon: K63.5

## 2018-01-03 DIAGNOSIS — H5203 Hypermetropia, bilateral: Secondary | ICD-10-CM | POA: Diagnosis not present

## 2018-01-03 DIAGNOSIS — H2513 Age-related nuclear cataract, bilateral: Secondary | ICD-10-CM | POA: Diagnosis not present

## 2018-01-17 DIAGNOSIS — Z9989 Dependence on other enabling machines and devices: Secondary | ICD-10-CM | POA: Insufficient documentation

## 2018-01-17 DIAGNOSIS — J343 Hypertrophy of nasal turbinates: Secondary | ICD-10-CM | POA: Insufficient documentation

## 2018-01-17 DIAGNOSIS — J310 Chronic rhinitis: Secondary | ICD-10-CM | POA: Diagnosis not present

## 2018-01-17 DIAGNOSIS — G4733 Obstructive sleep apnea (adult) (pediatric): Secondary | ICD-10-CM | POA: Insufficient documentation

## 2018-01-17 DIAGNOSIS — J3489 Other specified disorders of nose and nasal sinuses: Secondary | ICD-10-CM | POA: Diagnosis not present

## 2018-02-01 DIAGNOSIS — R972 Elevated prostate specific antigen [PSA]: Secondary | ICD-10-CM | POA: Diagnosis not present

## 2018-02-06 DIAGNOSIS — N401 Enlarged prostate with lower urinary tract symptoms: Secondary | ICD-10-CM | POA: Diagnosis not present

## 2018-02-06 DIAGNOSIS — R350 Frequency of micturition: Secondary | ICD-10-CM | POA: Diagnosis not present

## 2018-02-27 ENCOUNTER — Encounter: Payer: Medicare Other | Admitting: Family Medicine

## 2018-03-14 ENCOUNTER — Encounter: Payer: Medicare Other | Admitting: Family Medicine

## 2018-03-31 ENCOUNTER — Encounter: Payer: Self-pay | Admitting: Family Medicine

## 2018-03-31 ENCOUNTER — Other Ambulatory Visit: Payer: Self-pay

## 2018-03-31 ENCOUNTER — Ambulatory Visit (INDEPENDENT_AMBULATORY_CARE_PROVIDER_SITE_OTHER): Payer: Medicare Other | Admitting: Family Medicine

## 2018-03-31 VITALS — BP 130/90 | HR 64 | Temp 98.0°F | Ht 70.0 in | Wt 178.8 lb

## 2018-03-31 DIAGNOSIS — M791 Myalgia, unspecified site: Secondary | ICD-10-CM

## 2018-03-31 DIAGNOSIS — Z0000 Encounter for general adult medical examination without abnormal findings: Secondary | ICD-10-CM

## 2018-03-31 DIAGNOSIS — T466X5A Adverse effect of antihyperlipidemic and antiarteriosclerotic drugs, initial encounter: Secondary | ICD-10-CM | POA: Diagnosis not present

## 2018-03-31 DIAGNOSIS — Z1159 Encounter for screening for other viral diseases: Secondary | ICD-10-CM

## 2018-03-31 DIAGNOSIS — K219 Gastro-esophageal reflux disease without esophagitis: Secondary | ICD-10-CM

## 2018-03-31 LAB — COMPREHENSIVE METABOLIC PANEL
ALT: 13 U/L (ref 0–53)
AST: 14 U/L (ref 0–37)
Albumin: 4.5 g/dL (ref 3.5–5.2)
Alkaline Phosphatase: 38 U/L — ABNORMAL LOW (ref 39–117)
BUN: 13 mg/dL (ref 6–23)
CO2: 29 mEq/L (ref 19–32)
Calcium: 9.4 mg/dL (ref 8.4–10.5)
Chloride: 107 mEq/L (ref 96–112)
Creatinine, Ser: 1.05 mg/dL (ref 0.40–1.50)
GFR: 74.68 mL/min (ref >60.00)
Glucose, Bld: 99 mg/dL (ref 70–99)
Potassium: 4.5 mEq/L (ref 3.5–5.1)
Sodium: 143 mEq/L (ref 135–145)
Total Bilirubin: 0.7 mg/dL (ref 0.2–1.2)
Total Protein: 6.6 g/dL (ref 6.0–8.3)

## 2018-03-31 LAB — CBC WITH DIFFERENTIAL/PLATELET
Basophils Absolute: 0 10*3/uL (ref 0.0–0.1)
Basophils Relative: 0.7 % (ref 0.0–3.0)
Eosinophils Absolute: 0 10*3/uL (ref 0.0–0.7)
Eosinophils Relative: 1.3 % (ref 0.0–5.0)
HCT: 44.9 % (ref 39.0–52.0)
Hemoglobin: 15.3 g/dL (ref 13.0–17.0)
Lymphocytes Relative: 34 % (ref 12.0–46.0)
Lymphs Abs: 1.1 10*3/uL (ref 0.7–4.0)
MCHC: 34 g/dL (ref 30.0–36.0)
MCV: 94.4 fl (ref 78.0–100.0)
Monocytes Absolute: 0.2 10*3/uL (ref 0.1–1.0)
Monocytes Relative: 7 % (ref 3.0–12.0)
Neutro Abs: 1.8 10*3/uL (ref 1.4–7.7)
Neutrophils Relative %: 57 % (ref 43.0–77.0)
Platelets: 200 10*3/uL (ref 150.0–400.0)
RBC: 4.76 Mil/uL (ref 4.22–5.81)
RDW: 14 % (ref 11.5–15.5)
WBC: 3.2 10*3/uL — ABNORMAL LOW (ref 4.0–10.5)

## 2018-03-31 NOTE — Patient Instructions (Addendum)
Please return in 12 months I discussed with patient that the coding for this visit will include the wellness/preventive visit along with the code for a chronic problem follow up or acute/sick problem visit. Documentation reflects the need for this.  Medicare recommends an Annual Wellness Visit for all patients. Please schedule this to be done with our Nurse Educator, Maudie Mercury. This is an informative "talk" visit; it's goals are to ensure that your health care needs are being met and to give you education regarding avoiding falls, ensuring you are not suffering from depression or problems with memory or thinking, and to educate you on Advance Care Planning. It helps me take good care of you!   If you have any questions or concerns, please don't hesitate to send me a message via MyChart or call the office at [phone]. Thank you for visiting with [place] today! It's our pleasure caring for you.   Health Maintenance, Male A healthy lifestyle and preventive care is important for your health and wellness. Ask your health care provider about what schedule of regular examinations is right for you. What should I know about weight and diet? Eat a Healthy Diet  Eat plenty of vegetables, fruits, whole grains, low-fat dairy products, and lean protein.  Do not eat a lot of foods high in solid fats, added sugars, or salt.  Maintain a Healthy Weight Regular exercise can help you achieve or maintain a healthy weight. You should:  Do at least 150 minutes of exercise each week. The exercise should increase your heart rate and make you sweat (moderate-intensity exercise).  Do strength-training exercises at least twice a week.  Watch Your Levels of Cholesterol and Blood Lipids  Have your blood tested for lipids and cholesterol every 5 years starting at 68 years of age. If you are at high risk for heart disease, you should start having your blood tested when you are 68 years old. You may need to have your  cholesterol levels checked more often if: ? Your lipid or cholesterol levels are high. ? You are older than 68 years of age. ? You are at high risk for heart disease.  What should I know about cancer screening? Many types of cancers can be detected early and may often be prevented. Lung Cancer  You should be screened every year for lung cancer if: ? You are a current smoker who has smoked for at least 30 years. ? You are a former smoker who has quit within the past 15 years.  Talk to your health care provider about your screening options, when you should start screening, and how often you should be screened.  Colorectal Cancer  Routine colorectal cancer screening usually begins at 68 years of age and should be repeated every 5-10 years until you are 68 years old. You may need to be screened more often if early forms of precancerous polyps or small growths are found. Your health care provider may recommend screening at an earlier age if you have risk factors for colon cancer.  Your health care provider may recommend using home test kits to check for hidden blood in the stool.  A small camera at the end of a tube can be used to examine your colon (sigmoidoscopy or colonoscopy). This checks for the earliest forms of colorectal cancer.  Prostate and Testicular Cancer  Depending on your age and overall health, your health care provider may do certain tests to screen for prostate and testicular cancer.  Talk to your health  care provider about any symptoms or concerns you have about testicular or prostate cancer.  Skin Cancer  Check your skin from head to toe regularly.  Tell your health care provider about any new moles or changes in moles, especially if: ? There is a change in a mole's size, shape, or color. ? You have a mole that is larger than a pencil eraser.  Always use sunscreen. Apply sunscreen liberally and repeat throughout the day.  Protect yourself by wearing long sleeves,  pants, a wide-brimmed hat, and sunglasses when outside.  What should I know about heart disease, diabetes, and high blood pressure?  If you are 77-85 years of age, have your blood pressure checked every 3-5 years. If you are 75 years of age or older, have your blood pressure checked every year. You should have your blood pressure measured twice-once when you are at a hospital or clinic, and once when you are not at a hospital or clinic. Record the average of the two measurements. To check your blood pressure when you are not at a hospital or clinic, you can use: ? An automated blood pressure machine at a pharmacy. ? A home blood pressure monitor.  Talk to your health care provider about your target blood pressure.  If you are between 55-28 years old, ask your health care provider if you should take aspirin to prevent heart disease.  Have regular diabetes screenings by checking your fasting blood sugar level. ? If you are at a normal weight and have a low risk for diabetes, have this test once every three years after the age of 62. ? If you are overweight and have a high risk for diabetes, consider being tested at a younger age or more often.  A one-time screening for abdominal aortic aneurysm (AAA) by ultrasound is recommended for men aged 64-75 years who are current or former smokers. What should I know about preventing infection? Hepatitis B If you have a higher risk for hepatitis B, you should be screened for this virus. Talk with your health care provider to find out if you are at risk for hepatitis B infection. Hepatitis C Blood testing is recommended for:  Everyone born from [birth year] through 1966.  Anyone with known risk factors for hepatitis C.  Sexually Transmitted Diseases (STDs)  You should be screened each year for STDs including gonorrhea and chlamydia if: ? You are sexually active and are younger than 68 years of age. ? You are older than 68 years of age and your health care  provider tells you that you are at risk for this type of infection. ? Your sexual activity has changed since you were last screened and you are at an increased risk for chlamydia or gonorrhea. Ask your health care provider if you are at risk.  Talk with your health care provider about whether you are at high risk of being infected with HIV. Your health care provider may recommend a prescription medicine to help prevent HIV infection.  What else can I do?  Schedule regular health, dental, and eye exams.  Stay current with your vaccines (immunizations).  Do not use any tobacco products, such as cigarettes, chewing tobacco, and e-cigarettes. If you need help quitting, ask your health care provider.  Limit alcohol intake to no more than 2 drinks per day. One drink equals 12 ounces of beer, 5 ounces of wine, or 1 ounces of hard liquor.  Do not use street drugs.  Do not share needles.  Ask your health care provider for help if you need support or information about quitting drugs.  Tell your health care provider if you often feel depressed.  Tell your health care provider if you have ever been abused or do not feel safe at home. This information is not intended to replace advice given to you by your health care provider. Make sure you discuss any questions you have with your health care provider. Document Released: 05/13/2008 Document Revised: 07/14/2016 Document Reviewed: 08/19/2015 Elsevier Interactive Patient Education  [employer].

## 2018-03-31 NOTE — Progress Notes (Signed)
Subjective  Chief Complaint  Patient presents with  . Annual Exam    no complaints today doing well.    HPI: Eugene Mcdaniel is a 67 y.o. male who presents to [place] at [facility] today for a Male Wellness Visit.   Wellness Visit: annual visit with health maintenance review and exam    Doing great. No new concerns. Dyspepsia is well controlled. Down to ppi daily and hoping to wean further.   SAR well controlled.   Happy and healthy. Drinks 1-2 glasses/drinks per night typically. Exercises. Weight is down.  Lifestyle: Body mass index is 25.66 kg/m. Wt Readings from Last 3 Encounters:  03/31/18 178 lb 12.8 oz (81.1 kg)  12/27/17 191 lb 9.6 oz (86.9 kg)   Diet: wheat free now; feels good Exercise: frequently,   Patient Active Problem List   Diagnosis Date Noted  . Dyspepsia 06/13/2017    Priority: High  . Gastroesophageal reflux disease without esophagitis 03/28/2017    Priority: High  . Familial hyperlipidemia 03/28/2017    Priority: High  . Constipation 06/15/2017    Priority: Medium  . BPH with urinary obstruction 03/28/2017    Priority: Medium  . Primary osteoarthritis involving multiple joints 03/28/2017    Priority: Medium  . Seasonal allergic rhinitis due to pollen 03/28/2017    Priority: Low  . Tendinitis of both rotator cuffs 10/21/2015    Priority: Low  . Hyperplastic colon polyp 12/28/2017  . Refusal of statin medication by patient 12/27/2017   Health Maintenance  Topic Date Due  . Hepatitis C Screening  01-05-1951  . PNA vac Low Risk Adult (1 of 2 - PCV13) 04/01/2019 (Originally 05/13/2015)  . TETANUS/TDAP  02/27/2026  . COLONOSCOPY  09/10/2027  . INFLUENZA VACCINE  Discontinued   Immunization History  Administered Date(s) Administered  . Tdap 02/28/2016   We updated and reviewed the patient's past history in detail and it is documented below. Allergies: Patient is allergic to atorvastatin and simvastatin. Past Medical  History  has a past medical history of Allergy, Colon polyp, GERD (gastroesophageal reflux disease), Hypercholesterolemia, Hyperplastic colon polyp (12/28/2017), Primary osteoarthritis involving multiple joints, Seasonal allergic rhinitis due to pollen, and Sleep apnea. Past Surgical History  has a past surgical history that includes Appendectomy; colonoscopy  (06/2005); Tonsillectomy and adenoidectomy (1962); and Medial collateral ligament repair, knee (Right). Social History Patient  reports that he quit smoking about 31 years ago. His smoking use included cigarettes. He has a 60.00 pack-year smoking history. He has quit using smokeless tobacco. He reports that he drinks alcohol. He reports that he does not use drugs. Family History Patient family history includes Arthritis in his father; Healthy in his daughter and sister; Heart disease in his father; Lung cancer in his mother. Review of Systems: Constitutional: negative for fever or malaise Ophthalmic: negative for photophobia, double vision or loss of vision Cardiovascular: negative for chest pain, dyspnea on exertion, or new LE swelling Respiratory: negative for SOB or persistent cough Gastrointestinal: negative for abdominal pain, change in bowel habits or melena Genitourinary: negative for dysuria or gross hematuria Musculoskeletal: negative for new gait disturbance or muscular weakness Integumentary: negative for new or persistent rashes, no breast lumps Neurological: negative for TIA or stroke symptoms Psychiatric: negative for SI or delusions Allergic/Immunologic: negative for hives  Patient Care Team    Relationship Specialty Notifications Start End  Leamon Arnt, MD PCP - General Family Medicine  12/27/17   Irine Seal, MD Attending Physician Urology  12/27/17   Breck Coons, MD Consulting Physician Gastroenterology  12/28/17    Objective  Vitals: BP 130/90   Pulse 64   Temp 98 F (36.7 C)   Ht 5\' 10"  (1.778 m)   Wt  178 lb 12.8 oz (81.1 kg)   BMI 25.66 kg/m  General:  Well developed, well nourished, no acute distress  Psych:  Alert and orientedx3,normal mood and affect HEENT:  Normocephalic, atraumatic, non-icteric sclera, PERRL, oropharynx is clear without mass or exudate, supple neck without adenopathy, mass or thyromegaly Cardiovascular:  Normal S1, S2, RRR without gallop, rub or murmur, nondisplaced PMI, +2 distal pulses in bilateral upper and lower extremities. Respiratory:  Good breath sounds bilaterally, CTAB with normal respiratory effort Gastrointestinal: normal bowel sounds, soft, non-tender, no noted masses. No HSM, small umbilical hernia present MSK: no deformities, contusions. Joints are without erythema or swelling. Spine and CVA region are nontender Skin:  Warm, no rashes or suspicious lesions noted, sun changes present scalp and face Neurologic:    Mental status is normal. CN 2-11 are normal. Gross motor and sensory exams are normal. Stable gait. No tremor GU: No inguinal hernias or adenopathy are appreciated bilaterally  Assessment  1. Annual physical exam   2. Gastroesophageal reflux disease without esophagitis   3. Need for hepatitis C screening test   4. Myalgia due to statin      Plan  Male Wellness Visit:  Age appropriate Health Maintenance and Prevention measures were discussed with patient. Included topics are cancer screening recommendations, ways to keep healthy (see AVS) including dietary and exercise recommendations, regular eye and dental care, use of seat belts, and avoidance of moderate alcohol use and tobacco use. Defers PSA after discussion.  BMI: discussed patient's BMI and encouraged positive lifestyle modifications to help get to or maintain a target BMI.  HM needs and immunizations were addressed and ordered. See below for orders. See HM and immunization section for updates.  Routine labs and screening tests ordered including cmp, cbc and lipids where  appropriate.  Discussed recommendations regarding Vit D and calcium supplementation (see AVS)  Hep c screen  Wean off ppi if able; can change to H2blocker if needed.   No further lipid screens at this time. He defers; consider checking q 3-5 to monitor.   Follow up: Return in 1 year (on 04/01/2019) for cpe.   Commons side effects, risks, benefits, and alternatives for medications and treatment plan prescribed today were discussed, and the patient expressed understanding of the given instructions. Patient is instructed to call or message via MyChart if he/she has any questions or concerns regarding our treatment plan. No barriers to understanding were identified. We discussed Red Flag symptoms and signs in detail. Patient expressed understanding regarding what to do in case of urgent or emergency type symptoms.   Medication list was reconciled, printed and provided to the patient in AVS. Patient instructions and summary information was reviewed with the patient as documented in the AVS. This note was prepared with assistance of Dragon voice recognition software. Occasional wrong-word or sound-a-like substitutions may have occurred due to the inherent limitations of voice recognition software  Orders Placed This Encounter  Procedures  . CBC with Differential/Platelet  . Comprehensive metabolic panel  . HIV antibody  . Hepatitis C antibody   No orders of the defined types were placed in this encounter.

## 2018-04-03 LAB — HEPATITIS C ANTIBODY
Hepatitis C Ab: NONREACTIVE
SIGNAL TO CUT-OFF: 0.05 (ref <1.00)

## 2018-04-03 LAB — HIV ANTIBODY (ROUTINE TESTING W REFLEX): HIV 1&2 Ab, 4th Generation: NONREACTIVE

## 2018-05-19 MED FILL — SHINGRIX 50 MCG SUS: 50 | 1 days supply | Qty: 1 | Fill #0

## 2018-06-30 ENCOUNTER — Ambulatory Visit: Payer: Medicare Other | Admitting: Family Medicine

## 2018-06-30 ENCOUNTER — Encounter: Payer: Self-pay | Admitting: Family Medicine

## 2018-06-30 ENCOUNTER — Other Ambulatory Visit: Payer: Self-pay

## 2018-06-30 VITALS — BP 122/82 | HR 67 | Temp 98.4°F | Ht 70.0 in | Wt 178.6 lb

## 2018-06-30 DIAGNOSIS — H00015 Hordeolum externum left lower eyelid: Secondary | ICD-10-CM | POA: Diagnosis not present

## 2018-06-30 MED ORDER — ERYTHROMYCIN 5 MG/GM OP OINT: 1.0000 "application " | TOPICAL_OINTMENT | Freq: Three times a day (TID) | OPHTHALMIC | 0 refills | Status: DC

## 2018-06-30 NOTE — Progress Notes (Signed)
Subjective  CC:  Chief Complaint  Patient presents with  . Eye Problem    Bi-lateral under the eye itching and redness x 1 week     HPI: Eugene Mcdaniel is a 67 y.o. male who presents to the office today to address the problems listed above in the chief complaint.  Red swollen left lower lid with itching. No drainage. Using compresses, now with pustule as well. No eye pain or conjunctival redness or visual disturbance. No systemic sxs.    Assessment  1. Hordeolum externum left lower eyelid      Plan   stye:  Educated, warm moist compress and e-mycin ointment.   Follow up: Return if symptoms worsen or fail to improve.   No orders of the defined types were placed in this encounter.  Meds ordered this encounter  Medications  . erythromycin ophthalmic ointment    Sig: Place 1 application into the left eye 3 (three) times daily.    Dispense:  3.5 g    Refill:  0      I reviewed the patients updated PMH, FH, and SocHx.    Patient Active Problem List   Diagnosis Date Noted  . Dyspepsia 06/13/2017    Priority: High  . Gastroesophageal reflux disease without esophagitis 03/28/2017    Priority: High  . Familial hyperlipidemia 03/28/2017    Priority: High  . Constipation 06/15/2017    Priority: Medium  . BPH with urinary obstruction 03/28/2017    Priority: Medium  . Primary osteoarthritis involving multiple joints 03/28/2017    Priority: Medium  . Seasonal allergic rhinitis due to pollen 03/28/2017    Priority: Low  . Tendinitis of both rotator cuffs 10/21/2015    Priority: Low  . Obstructive sleep apnea 01/17/2018  . Rhinitis, chronic 01/17/2018  . 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 Hyperplastic colon polyp 12/28/2017  . Refusal of statin medication by patient 12/27/2017   Current Meds  Medication Sig  . Ascorbic Acid (VITAMIN C CR) 500 MG CPCR Take by mouth.  Marland Kitchen BIOTIN PO Take by mouth.  . Cholecalciferol (VITAMIN D3) 2000 units TABS Take by mouth.  .  doxazosin (CARDURA) 4 MG tablet Take by mouth.  . fluticasone (FLONASE) 50 MCG/ACT nasal spray 1 spray by Both Nostrils route daily.  Marland Kitchen levocetirizine (XYZAL) 5 MG tablet Take 5 mg by mouth every evening.  Marland Kitchen MAGNESIUM CITRATE PO Take by mouth.  . omega-3 acid ethyl esters (LOVAZA) 1 g capsule Take by mouth.  Marland Kitchen omeprazole (PRILOSEC OTC) 20 MG tablet Take by mouth.  Marland Kitchen PROBIOTIC PRODUCT PO Take by mouth.  . vitamin B-12 (CYANOCOBALAMIN) 1000 MCG tablet Take by mouth.  Marland Kitchen VITAMIN K PO Take by mouth.    Allergies: Patient is allergic to atorvastatin and simvastatin. Family History: Patient family history includes Arthritis in his father; Healthy in his daughter and sister; Heart disease in his father; Lung cancer in his mother. Social History:  Patient  reports that he quit smoking about 31 years ago. His smoking use included cigarettes. He has a 60.00 pack-year smoking history. He has quit using smokeless tobacco. He reports that he drinks alcohol. He reports that he does not use drugs.  Review of Systems: Constitutional: Negative for fever malaise or anorexia Cardiovascular: negative for chest pain Respiratory: negative for SOB or persistent cough Gastrointestinal: negative for abdominal pain  Objective  Vitals: BP 122/82   Pulse 67   Temp 98.4 F (36.9 C)   Ht 5'  10" (1.778 m)   Wt 178 lb 9.6 oz (81 kg)   SpO2 98%   BMI 25.63 kg/m  General: no acute distress , A&Ox3 HEENT: PEERL, conjunctiva normal, left lower lid with raised external stye with lower lid swelling redness    Commons side effects, risks, benefits, and alternatives for medications and treatment plan prescribed today were discussed, and the patient expressed understanding of the given instructions. Patient is instructed to call or message via MyChart if he/she has any questions or concerns regarding our treatment plan. No barriers to understanding were identified. We discussed Red Flag symptoms and signs in detail.  Patient expressed understanding regarding what to do in case of urgent or emergency type symptoms.   Medication list was reconciled, printed and provided to the patient in AVS. Patient instructions and summary information was reviewed with the patient as documented in the AVS. This note was prepared with assistance of Dragon voice recognition software. Occasional wrong-word or sound-a-like substitutions may have occurred due to the inherent limitations of voice recognition software

## 2018-06-30 NOTE — Patient Instructions (Signed)
Please follow up if symptoms do not improve or as needed.   Stye A stye is a bump on your eyelid caused by a bacterial infection. A stye can form inside the eyelid (internal stye) or outside the eyelid (external stye). An internal stye may be caused by an infected oil-producing gland inside your eyelid. An external stye may be caused by an infection at the base of your eyelash (hair follicle). Styes are very common. Anyone can get them at any age. They usually occur in just one eye, but you may have more than one in either eye. What are the causes? The infection is almost always caused by bacteria called Staphylococcus aureus. This is a common type of bacteria that lives on your skin. What increases the risk? You may be at higher risk for a stye if you have had one before. You may also be at higher risk if you have:  Diabetes.  Long-term illness.  Long-term eye redness.  A skin condition called seborrhea.  High fat levels in your blood (lipids).  What are the signs or symptoms? Eyelid pain is the most common symptom of a stye. Internal styes are more painful than external styes. Other signs and symptoms may include:  Painful swelling of your eyelid.  A scratchy feeling in your eye.  Tearing and redness of your eye.  Pus draining from the stye.  How is this diagnosed? Your health care provider may be able to diagnose a stye just by examining your eye. The health care provider may also check to make sure:  You do not have a fever or other signs of a more serious infection.  The infection has not spread to other parts of your eye or areas around your eye.  How is this treated? Most styes will clear up in a few days without treatment. In some cases, you may need to use antibiotic drops or ointment to prevent infection. Your health care provider may have to drain the stye surgically if your stye is:  Large.  Causing a lot of pain.  Interfering with your vision.  This can be  done using a thin blade or a needle. Follow these instructions at home:  Take medicines only as directed by your health care provider.  Apply a clean, warm compress to your eye for 10 minutes, 4 times a day.  Do not wear contact lenses or eye makeup until your stye has healed.  Do not try to pop or drain the stye. Contact a health care provider if:  You have chills or a fever.  Your stye does not go away after several days.  Your stye affects your vision.  Your eyeball becomes swollen, red, or painful. This information is not intended to replace advice given to you by your health care provider. Make sure you discuss any questions you have with your health care provider. Document Released: 08/25/2005 Document Revised: 07/11/2016 Document Reviewed: 03/01/2014 Elsevier Interactive Patient Education  [employer].

## 2018-07-06 DIAGNOSIS — H0012 Chalazion right lower eyelid: Secondary | ICD-10-CM | POA: Diagnosis not present

## 2018-07-06 DIAGNOSIS — H0011 Chalazion right upper eyelid: Secondary | ICD-10-CM | POA: Diagnosis not present

## 2018-07-12 DIAGNOSIS — H0011 Chalazion right upper eyelid: Secondary | ICD-10-CM | POA: Diagnosis not present

## 2018-08-07 MED FILL — SHINGRIX 50 MCG SUS: 50 | 1 days supply | Qty: 1 | Fill #1

## 2018-10-30 ENCOUNTER — Encounter: Payer: Self-pay | Admitting: Family Medicine

## 2018-10-30 MED ORDER — LEVOCETIRIZINE DIHYDROCHLORIDE 5 MG PO TABS: 5.0000 mg | ORAL_TABLET | Freq: Every evening | ORAL | 3 refills | Status: DC

## 2018-10-30 NOTE — Addendum Note (Signed)
Addended bySigurd Sos on: 10/30/2018 11:57 AM   Modules accepted: Orders

## 2018-10-31 ENCOUNTER — Encounter: Payer: Self-pay | Admitting: Family Medicine

## 2018-10-31 ENCOUNTER — Ambulatory Visit (INDEPENDENT_AMBULATORY_CARE_PROVIDER_SITE_OTHER): Payer: Medicare Other

## 2018-10-31 ENCOUNTER — Other Ambulatory Visit: Payer: Self-pay

## 2018-10-31 ENCOUNTER — Ambulatory Visit (INDEPENDENT_AMBULATORY_CARE_PROVIDER_SITE_OTHER): Payer: Medicare Other | Admitting: Family Medicine

## 2018-10-31 VITALS — BP 122/78 | HR 73 | Temp 97.9°F | Wt 179.4 lb

## 2018-10-31 DIAGNOSIS — M19041 Primary osteoarthritis, right hand: Secondary | ICD-10-CM

## 2018-10-31 DIAGNOSIS — M19042 Primary osteoarthritis, left hand: Secondary | ICD-10-CM

## 2018-10-31 DIAGNOSIS — M1812 Unilateral primary osteoarthritis of first carpometacarpal joint, left hand: Secondary | ICD-10-CM | POA: Diagnosis not present

## 2018-10-31 MED ORDER — DICLOFENAC SODIUM 1 % TD GEL: 2.0000 g | Freq: Four times a day (QID) | TRANSDERMAL | 3 refills | Status: DC | PRN

## 2018-10-31 NOTE — Progress Notes (Signed)
Subjective  CC:  Chief Complaint  Patient presents with  . Hand Pain    bi-lateral pain in both hands, lt thumb and palm    HPI: Eugene Mcdaniel is a 67 y.o. male who presents to the office today to address the problems listed above in the chief complaint.  Bilateral hand joint pain x 1-2 years, worsening. Main painful joints are left 1st mcp and right 3rd mcp. Was a former athlete. Keeps active. Denies red hot swollen joints. Has never taken anything for pain. Some loss of movement at left 1st mcp. No triggering. No other joint problems.   Assessment  1. Primary osteoarthritis of both hands      Plan   OA:  Education given, see AVS. Start tx: options discussed include tylenol, nsaids, voltaren gel, tumeric, glucosamine. F/u if not improving. Check xrays.   Follow up: cpe in may 2020   Orders Placed This Encounter  Procedures  . DG Hand Complete Left  . DG Hand Complete Right   Meds ordered this encounter  Medications  . DISCONTD: diclofenac sodium (VOLTAREN) 1 % GEL    Sig: Apply 2 g topically 4 (four) times daily as needed (pain).    Dispense:  200 g    Refill:  3  . diclofenac sodium (VOLTAREN) 1 % GEL    Sig: Apply 2 g topically 4 (four) times daily as needed (pain).    Dispense:  200 g    Refill:  3      I reviewed the patients updated PMH, FH, and SocHx.    Patient Active Problem List   Diagnosis Date Noted  . Dyspepsia 06/13/2017    Priority: High  . Gastroesophageal reflux disease without esophagitis 03/28/2017    Priority: High  . Familial hyperlipidemia 03/28/2017    Priority: High  . Constipation 06/15/2017    Priority: Medium  . BPH with urinary obstruction 03/28/2017    Priority: Medium  . Primary osteoarthritis involving multiple joints 03/28/2017    Priority: Medium  . Seasonal allergic rhinitis due to pollen 03/28/2017    Priority: Low  . Tendinitis of both rotator cuffs 10/21/2015    Priority: Low  . Obstructive sleep apnea 01/17/2018  .  Rhinitis, chronic 01/17/2018  . 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 Hyperplastic colon polyp 12/28/2017  . Refusal of statin medication by patient 12/27/2017   Current Meds  Medication Sig  . Ascorbic Acid (VITAMIN C CR) 500 MG CPCR Take by mouth.  Marland Kitchen BIOTIN PO Take by mouth.  . Cholecalciferol (VITAMIN D3) 2000 units TABS Take by mouth.  . doxazosin (CARDURA) 4 MG tablet Take by mouth.  . erythromycin ophthalmic ointment Place 1 application into the left eye 3 (three) times daily.  . fluticasone (FLONASE) 50 MCG/ACT nasal spray 1 spray by Both Nostrils route daily.  Marland Kitchen levocetirizine (XYZAL) 5 MG tablet Take 1 tablet (5 mg total) by mouth every evening.  Marland Kitchen MAGNESIUM CITRATE PO Take by mouth.  . omega-3 acid ethyl esters (LOVAZA) 1 g capsule Take by mouth.  Marland Kitchen omeprazole (PRILOSEC OTC) 20 MG tablet Take by mouth.  Marland Kitchen PROBIOTIC PRODUCT PO Take by mouth.  . vitamin B-12 (CYANOCOBALAMIN) 1000 MCG tablet Take by mouth.  Marland Kitchen VITAMIN K PO Take by mouth.    Allergies: Patient is allergic to atorvastatin and simvastatin. Family History: Patient family history includes Arthritis in his father; Healthy in his daughter and sister; Heart disease in his father; Lung cancer in his  mother. Social History:  Patient  reports that he quit smoking about 31 years ago. His smoking use included cigarettes. He has a 60.00 pack-year smoking history. He has quit using smokeless tobacco. He reports that he drinks alcohol. He reports that he does not use drugs.  Review of Systems: Constitutional: Negative for fever malaise or anorexia Cardiovascular: negative for chest pain Respiratory: negative for SOB or persistent cough Gastrointestinal: negative for abdominal pain  Objective  Vitals: BP 122/78   Pulse 73   Temp 97.9 F (36.6 C)   Wt 179 lb 6.4 oz (81.4 kg)   SpO2 97%   BMI 25.74 kg/m  General: no acute distress , A&Ox3 Hands: multiple OA changes in distal and prox IP joints. Left 1st MCP is  chronically subluxed with decreased rom. No erythema, warmth or active synovitis. Nl grip.     Commons side effects, risks, benefits, and alternatives for medications and treatment plan prescribed today were discussed, and the patient expressed understanding of the given instructions. Patient is instructed to call or message via MyChart if he/she has any questions or concerns regarding our treatment plan. No barriers to understanding were identified. We discussed Red Flag symptoms and signs in detail. Patient expressed understanding regarding what to do in case of urgent or emergency type symptoms.   Medication list was reconciled, printed and provided to the patient in AVS. Patient instructions and summary information was reviewed with the patient as documented in the AVS. This note was prepared with assistance of Dragon voice recognition software. Occasional wrong-word or sound-a-like substitutions may have occurred due to the inherent limitations of voice recognition software

## 2018-10-31 NOTE — Patient Instructions (Addendum)
Please return in May for your annual complete physical; please come fasting.  You may start taking tylenol ES 1-2 tabs twice a day. OR Advil 2-3 tabs up to 3x/ day OR alleve 2 tabs twice a day for your hand pain. You may use the topical gel medication for pain up to four times per day.  Tumeric and/or CBD oil and/or glucosamine are other choices.   Please go to our [facility] office to get your xrays done. You can walk in M-F between 8am and 5pm. Tell them you are there for xrays ordered by me. They will send me the results, then I will let you know the results with instructions.   Address: [place], [place], [place]  (office sits at [address] at [address] intersection; from here, turn left onto [place] [address]), take to [address], turn right and go for a mile or so, office will be on left across form [address] )  If you have any questions or concerns, please don't hesitate to send me a message via MyChart or call the office at [phone]. Thank you for visiting with [place] today! It's our pleasure caring for you.   Osteoarthritis Osteoarthritis is a type of arthritis that affects tissue that covers the ends of bones in joints (cartilage). Cartilage acts as a cushion between the bones and helps them move smoothly. Osteoarthritis results when cartilage in the joints gets worn down. Osteoarthritis is sometimes called "wear and tear" arthritis. Osteoarthritis is the most common form of arthritis. It often occurs in older people. It is a condition that gets worse over time (a progressive condition). Joints that are most often affected by this condition are in:  Fingers.  Toes.  Hips.  Knees.  Spine, including neck and lower back.  What are the causes? This condition is caused by age-related wearing down of cartilage that covers the ends of bones. What increases the risk? The following factors may make you more  likely to develop this condition:  Older age.  Being overweight or obese.  Overuse of joints, such as in athletes.  Past injury of a joint.  Past surgery on a joint.  Family history of osteoarthritis.  What are the signs or symptoms? The main symptoms of this condition are pain, swelling, and stiffness in the joint. The joint may lose its shape over time. Small pieces of bone or cartilage may break off and float inside of the joint, which may cause more pain and damage to the joint. Small deposits of bone (osteophytes) may grow on the edges of the joint. Other symptoms may include:  A grating or scraping feeling inside the joint when you move it.  Popping or creaking sounds when you move.  Symptoms may affect one or more joints. Osteoarthritis in a major joint, such as your knee or hip, can make it painful to walk or exercise. If you have osteoarthritis in your hands, you might not be able to grip items, twist your hand, or control small movements of your hands and fingers (fine motor skills). How is this diagnosed? This condition may be diagnosed based on:  Your medical history.  A physical exam.  Your symptoms.  X-rays of the affected joint(s).  Blood tests to rule out other types of arthritis.  How is this treated? There is no cure for this condition, but treatment can help to control pain and improve joint function. Treatment plans may include:  A prescribed exercise program that allows for rest and joint relief. You may work with a physical therapist.  A weight control plan.  Pain relief techniques, such as: ? Applying heat and cold to the joint. ? Electric pulses delivered to nerve endings under the skin (transcutaneous electrical nerve stimulation, or TENS). ? Massage. ? Certain nutritional supplements.  NSAIDs or prescription medicines to help relieve pain.  Medicine to help relieve pain and inflammation (corticosteroids). This can be given by mouth (orally)  or as an injection.  Assistive devices, such as a brace, wrap, splint, specialized glove, or cane.  Surgery, such as: ? An osteotomy. This is done to reposition the bones and relieve pain or to remove loose pieces of bone and cartilage. ? Joint replacement surgery. You may need this surgery if you have very bad (advanced) osteoarthritis.  Follow these instructions at home: Activity  Rest your affected joints as directed by your health care provider.  Do not drive or use heavy machinery while taking prescription pain medicine.  Exercise as directed. Your health care provider or physical therapist may recommend specific types of exercise, such as: ? Strengthening exercises. These are done to strengthen the muscles that support joints that are affected by arthritis. They can be performed with weights or with exercise bands to add resistance. ? Aerobic activities. These are exercises, such as brisk walking or water aerobics, that get your heart pumping. ? Range-of-motion activities. These keep your joints easy to move. ? Balance and agility exercises. Managing pain, stiffness, and swelling  If directed, apply heat to the affected area as often as told by your health care provider. Use the heat source that your health care provider recommends, such as a moist heat pack or a heating pad. ? If you have a removable assistive device, remove it as told by your health care provider. ? Place a towel between your skin and the heat source. If your health care provider tells you to keep the assistive device on while you apply heat, place a towel between the assistive device and the heat source. ? Leave the heat on for 20-30 minutes. ? Remove the heat if your skin turns bright red. This is especially important if you are unable to feel pain, heat, or cold. You may have a greater risk of getting burned.  If directed, put ice on the affected joint: ? If you have a removable assistive device, remove it as  told by your health care provider. ? Put ice in a plastic bag. ? Place a towel between your skin and the bag. If your health care provider tells you to keep the assistive device on during icing, place a towel between the assistive device and the bag. ? Leave the ice on for 20 minutes, 2-3 times a day. General instructions  Take over-the-counter and prescription medicines only as told by your health care provider.  Maintain a healthy weight. Follow instructions from your health care provider for weight control. These may include dietary restrictions.  Do not use any products that contain nicotine or tobacco, such as cigarettes and e-cigarettes. These can delay bone healing. If you need help quitting, ask your health care provider.  Use assistive devices as directed by your health care provider.  Keep all follow-up visits as told by your health care provider. This is important. Where to find more information:  [employer] of Arthritis and Musculoskeletal and Skin Diseases: www.niams.SouthExposed.es  [employer] on Aging: http://kim-miller.com/  American College of Rheumatology:  www.rheumatology.org Contact a health care provider if:  Your skin turns red.  You develop a rash.  You have pain that gets worse.  You have a fever along with joint or muscle aches. Get help right away if:  You lose a lot of weight.  You suddenly lose your appetite.  You have night sweats. Summary  Osteoarthritis is a type of arthritis that affects tissue covering the ends of bones in joints (cartilage).  This condition is caused by age-related wearing down of cartilage that covers the ends of bones.  The main symptom of this condition is pain, swelling, and stiffness in the joint.  There is no cure for this condition, but treatment can help to control pain and improve joint function. This information is not intended to replace advice given to you by your health care provider. Make sure you discuss  any questions you have with your health care provider. Document Released: 11/15/2005 Document Revised: 07/19/2016 Document Reviewed: 07/19/2016 Elsevier Interactive Patient Education  [employer].

## 2018-12-19 ENCOUNTER — Encounter: Payer: Self-pay | Admitting: Family Medicine

## 2018-12-19 ENCOUNTER — Other Ambulatory Visit: Payer: Self-pay | Admitting: *Deleted

## 2018-12-19 MED ORDER — OMEPRAZOLE MAGNESIUM 20 MG PO TBEC: 20.0000 mg | DELAYED_RELEASE_TABLET | Freq: Every day | ORAL | 1 refills | Status: DC

## 2018-12-20 ENCOUNTER — Other Ambulatory Visit: Payer: Self-pay | Admitting: *Deleted

## 2018-12-20 MED ORDER — OMEPRAZOLE 20 MG PO CPDR: 20.0000 mg | DELAYED_RELEASE_CAPSULE | Freq: Every day | ORAL | 3 refills | Status: DC

## 2018-12-25 ENCOUNTER — Other Ambulatory Visit: Payer: Self-pay | Admitting: *Deleted

## 2018-12-25 MED ORDER — OMEPRAZOLE 20 MG PO CPDR: 20.0000 mg | DELAYED_RELEASE_CAPSULE | Freq: Every day | ORAL | 3 refills | Status: DC

## 2019-01-08 DIAGNOSIS — H5203 Hypermetropia, bilateral: Secondary | ICD-10-CM | POA: Diagnosis not present

## 2019-01-08 DIAGNOSIS — H2513 Age-related nuclear cataract, bilateral: Secondary | ICD-10-CM | POA: Diagnosis not present

## 2019-01-10 DIAGNOSIS — G4733 Obstructive sleep apnea (adult) (pediatric): Secondary | ICD-10-CM | POA: Diagnosis not present

## 2019-02-02 DIAGNOSIS — G4733 Obstructive sleep apnea (adult) (pediatric): Secondary | ICD-10-CM | POA: Diagnosis not present

## 2019-02-21 LAB — PSA: PSA: 2.21

## 2019-03-01 ENCOUNTER — Encounter: Payer: Self-pay | Admitting: *Deleted

## 2019-03-13 DIAGNOSIS — H43811 Vitreous degeneration, right eye: Secondary | ICD-10-CM | POA: Diagnosis not present

## 2019-04-02 ENCOUNTER — Encounter: Payer: Medicare Other | Admitting: Family Medicine

## 2019-04-10 DIAGNOSIS — H43811 Vitreous degeneration, right eye: Secondary | ICD-10-CM | POA: Diagnosis not present

## 2019-04-25 ENCOUNTER — Encounter: Payer: Self-pay | Admitting: Family Medicine

## 2019-05-25 DIAGNOSIS — G4733 Obstructive sleep apnea (adult) (pediatric): Secondary | ICD-10-CM | POA: Diagnosis not present

## 2019-05-25 DIAGNOSIS — K219 Gastro-esophageal reflux disease without esophagitis: Secondary | ICD-10-CM | POA: Diagnosis not present

## 2019-05-25 DIAGNOSIS — J3489 Other specified disorders of nose and nasal sinuses: Secondary | ICD-10-CM | POA: Diagnosis not present

## 2019-05-25 DIAGNOSIS — J342 Deviated nasal septum: Secondary | ICD-10-CM | POA: Diagnosis not present

## 2019-05-25 DIAGNOSIS — R0989 Other specified symptoms and signs involving the circulatory and respiratory systems: Secondary | ICD-10-CM | POA: Diagnosis not present

## 2019-06-14 ENCOUNTER — Encounter: Payer: Medicare Other | Admitting: Family Medicine

## 2019-06-18 ENCOUNTER — Encounter: Payer: Self-pay | Admitting: Family Medicine

## 2019-06-18 ENCOUNTER — Other Ambulatory Visit: Payer: Self-pay

## 2019-06-18 ENCOUNTER — Ambulatory Visit (INDEPENDENT_AMBULATORY_CARE_PROVIDER_SITE_OTHER): Payer: Medicare Other | Admitting: Family Medicine

## 2019-06-18 VITALS — BP 126/72 | HR 65 | Temp 98.2°F | Resp 16 | Ht 70.0 in | Wt 174.0 lb

## 2019-06-18 DIAGNOSIS — K219 Gastro-esophageal reflux disease without esophagitis: Secondary | ICD-10-CM

## 2019-06-18 DIAGNOSIS — L570 Actinic keratosis: Secondary | ICD-10-CM | POA: Diagnosis not present

## 2019-06-18 DIAGNOSIS — Z0000 Encounter for general adult medical examination without abnormal findings: Secondary | ICD-10-CM

## 2019-06-18 DIAGNOSIS — E7849 Other hyperlipidemia: Secondary | ICD-10-CM | POA: Diagnosis not present

## 2019-06-18 DIAGNOSIS — G4733 Obstructive sleep apnea (adult) (pediatric): Secondary | ICD-10-CM

## 2019-06-18 DIAGNOSIS — Z5329 Procedure and treatment not carried out because of patient's decision for other reasons: Secondary | ICD-10-CM

## 2019-06-18 DIAGNOSIS — Z5320 Procedure and treatment not carried out because of patient's decision for unspecified reasons: Secondary | ICD-10-CM

## 2019-06-18 DIAGNOSIS — Z9989 Dependence on other enabling machines and devices: Secondary | ICD-10-CM

## 2019-06-18 DIAGNOSIS — K429 Umbilical hernia without obstruction or gangrene: Secondary | ICD-10-CM

## 2019-06-18 LAB — CBC WITH DIFFERENTIAL/PLATELET
Basophils Absolute: 0 10*3/uL (ref 0.0–0.1)
Basophils Relative: 0.5 % (ref 0.0–3.0)
Eosinophils Absolute: 0 10*3/uL (ref 0.0–0.7)
Eosinophils Relative: 1 % (ref 0.0–5.0)
HCT: 45.3 % (ref 39.0–52.0)
Hemoglobin: 15.4 g/dL (ref 13.0–17.0)
Lymphocytes Relative: 29.9 % (ref 12.0–46.0)
Lymphs Abs: 1.3 10*3/uL (ref 0.7–4.0)
MCHC: 34 g/dL (ref 30.0–36.0)
MCV: 95.5 fl (ref 78.0–100.0)
Monocytes Absolute: 0.4 10*3/uL (ref 0.1–1.0)
Monocytes Relative: 8.4 % (ref 3.0–12.0)
Neutro Abs: 2.5 10*3/uL (ref 1.4–7.7)
Neutrophils Relative %: 60.2 % (ref 43.0–77.0)
Platelets: 199 10*3/uL (ref 150.0–400.0)
RBC: 4.75 Mil/uL (ref 4.22–5.81)
RDW: 13.6 % (ref 11.5–15.5)
WBC: 4.2 10*3/uL (ref 4.0–10.5)

## 2019-06-18 LAB — COMPREHENSIVE METABOLIC PANEL
ALT: 14 U/L (ref 0–53)
AST: 18 U/L (ref 0–37)
Albumin: 4.5 g/dL (ref 3.5–5.2)
Alkaline Phosphatase: 45 U/L (ref 39–117)
BUN: 17 mg/dL (ref 6–23)
CO2: 27 mEq/L (ref 19–32)
Calcium: 9.3 mg/dL (ref 8.4–10.5)
Chloride: 103 mEq/L (ref 96–112)
Creatinine, Ser: 0.99 mg/dL (ref 0.40–1.50)
GFR: 74.93 mL/min (ref >60.00)
Glucose, Bld: 96 mg/dL (ref 70–99)
Potassium: 4.6 mEq/L (ref 3.5–5.1)
Sodium: 139 mEq/L (ref 135–145)
Total Bilirubin: 0.7 mg/dL (ref 0.2–1.2)
Total Protein: 6.5 g/dL (ref 6.0–8.3)

## 2019-06-18 MED ORDER — PREVNAR 13 IM SUSP: 0.5000 mL | Freq: Once | INTRAMUSCULAR | 0 refills | Status: AC

## 2019-06-18 NOTE — Patient Instructions (Addendum)
Please return in 12 months for your annual complete physical; please come fasting.  I will release your lab results to you on your MyChart account with further instructions. Please reply with any questions.  Please take the RX to your pharmacy to receive your Prevnar vaccination to protect your from pnuemonia.   If you have any questions or concerns, please don't hesitate to send me a message via MyChart or call the office at [phone]. Thank you for visiting with [place] today! It's our pleasure caring for you.   Preventive Care 18 Years and Older, Male Preventive care refers to lifestyle choices and visits with your health care provider that can promote health and wellness. This includes:  A yearly physical exam. This is also called an annual well check.  Regular dental and eye exams.  Immunizations.  Screening for certain conditions.  Healthy lifestyle choices, such as diet and exercise. What can I expect for my preventive care visit? Physical exam Your health care provider will check:  Height and weight. These may be used to calculate body mass index (BMI), which is a measurement that tells if you are at a healthy weight.  Heart rate and blood pressure.  Your skin for abnormal spots. Counseling Your health care provider may ask you questions about:  Alcohol, tobacco, and drug use.  Emotional well-being.  Home and relationship well-being.  Sexual activity.  Eating habits.  History of falls.  Memory and ability to understand (cognition).  Work and work Statistician. What immunizations do I need?  Influenza (flu) vaccine  This is recommended every year. Tetanus, diphtheria, and pertussis (Tdap) vaccine  You may need a Td booster every 10 years. Varicella (chickenpox) vaccine  You may need this vaccine if you have not already been vaccinated. Zoster (shingles) vaccine  You may need this after age 68. Pneumococcal conjugate (PCV13) vaccine  One dose is  recommended after age 7. Pneumococcal polysaccharide (PPSV23) vaccine  One dose is recommended after age 22. Measles, mumps, and rubella (MMR) vaccine  You may need at least one dose of MMR if you were born in 1957 or later. You may also need a second dose. Meningococcal conjugate (MenACWY) vaccine  You may need this if you have certain conditions. Hepatitis A vaccine  You may need this if you have certain conditions or if you travel or work in places where you may be exposed to hepatitis A. Hepatitis B vaccine  You may need this if you have certain conditions or if you travel or work in places where you may be exposed to hepatitis B. Haemophilus influenzae type b (Hib) vaccine  You may need this if you have certain conditions. You may receive vaccines as individual doses or as more than one vaccine together in one shot (combination vaccines). Talk with your health care provider about the risks and benefits of combination vaccines. What tests do I need? Blood tests  Lipid and cholesterol levels. These may be checked every 5 years, or more frequently depending on your overall health.  Hepatitis C test.  Hepatitis B test. Screening  Lung cancer screening. You may have this screening every year starting at age 32 if you have a 30-pack-year history of smoking and currently smoke or have quit within the past 15 years.  Colorectal cancer screening. All adults should have this screening starting at age 49 and continuing until age 40. Your health care provider may recommend screening at age 33 if you are at increased risk. You will have  tests every 1-10 years, depending on your results and the type of screening test.  Prostate cancer screening. Recommendations will vary depending on your family history and other risks.  Diabetes screening. This is done by checking your blood sugar (glucose) after you have not eaten for a while (fasting). You may have this done every 1-3 years.   Abdominal aortic aneurysm (AAA) screening. You may need this if you are a current or former smoker.  Sexually transmitted disease (STD) testing. Follow these instructions at home: Eating and drinking  Eat a diet that includes fresh fruits and vegetables, whole grains, lean protein, and low-fat dairy products. Limit your intake of foods with high amounts of sugar, saturated fats, and salt.  Take vitamin and mineral supplements as recommended by your health care provider.  Do not drink alcohol if your health care provider tells you not to drink.  If you drink alcohol: ? Limit how much you have to 0-2 drinks a day. ? Be aware of how much alcohol is in your drink. In the [place] one drink equals one 12 oz bottle of beer (355 mL), one 5 oz glass of wine (148 mL), or one 1 oz glass of hard liquor (44 mL). Lifestyle  Take daily care of your teeth and gums.  Stay active. Exercise for at least 30 minutes on 5 or more days each week.  Do not use any products that contain nicotine or tobacco, such as cigarettes, e-cigarettes, and chewing tobacco. If you need help quitting, ask your health care provider.  If you are sexually active, practice safe sex. Use a condom or other form of protection to prevent STIs (sexually transmitted infections).  Talk with your health care provider about taking a low-dose aspirin or statin. What's next?  Visit your health care provider once a year for a well check visit.  Ask your health care provider how often you should have your eyes and teeth checked.  Stay up to date on all vaccines. This information is not intended to replace advice given to you by your health care provider. Make sure you discuss any questions you have with your health care provider. Document Released: 12/12/2015 Document Revised: 11/09/2018 Document Reviewed: 11/09/2018 Elsevier Patient Education  2020 [place].  Colesevelam tablets What is this medicine? COLESEVELAM (koh le SEV e  lam) is used to lower cholesterol in patients who are at risk of heart disease or stroke. This medicine is only for patients whose cholesterol level is not controlled by diet. It is also used in combination with diet and exercise to help lower blood sugar in adults with type 2 diabetes. This medicine may be used for other purposes; ask your health care provider or pharmacist if you have questions. COMMON BRAND NAME(S): WelChol What should I tell my health care provider before I take this medicine? They need to know if you have any of these conditions:  constipation or bowel obstruction  high triglyceride levels  history of pancreatitis caused by high triglyceride levels  an unusual or allergic reaction to colesevelam, other medicines, foods, dyes, or preservatives  pregnant or trying to get pregnant  breast-feeding How should I use this medicine? Take this medicine by mouth with at least 4 ounces (half a glass) of water. Follow the directions on the prescription label. Take with food. Take your medicine at regular intervals. Do not take it more often than directed. Do not stop taking except on your doctor's advice. Talk to your pediatrician regarding the use  of this medicine in children. Special care may be needed. Because of the tablet size, it is recommended that children use the oral suspension. Overdosage: If you think you have taken too much of this medicine contact a poison control center or emergency room at once. NOTE: This medicine is only for you. Do not share this medicine with others. What if I miss a dose? If you miss a dose, take it as soon as you can with your next meal. If it is almost time for your next dose, take only that dose. Do not take double or extra doses. What may interact with this medicine?  birth control pills  cyclosporine  insulin  medicines for diabetes like glimepiride, glipizide, and glyburide  medicines for seizures like carbamazepine,  phenobarbital, phenytoin  metformin  olmesartan  thyroid hormones  verapamil  vitamins  warfarin This list may not describe all possible interactions. Give your health care provider a list of all the medicines, herbs, non-prescription drugs, or dietary supplements you use. Also tell them if you smoke, drink alcohol, or use illegal drugs. Some items may interact with your medicine. What should I watch for while using this medicine? Visit your doctor or health care professional for regular checks on your progress. Your blood sugar and other tests will be measured regularly. This medicine is only part of a total cholesterol or blood sugar-lowering program. Your health care professional or dietician can suggest a low-cholesterol and low-fat diet that will reduce your risk of getting heart and blood vessel disease. Avoid alcohol and smoking, and keep a proper exercise schedule. To reduce the chance of getting constipated, drink plenty of water and increase the amount of fiber in your diet. Ask your doctor or health care professional for advice if you are constipated. If you are taking this medicine for diabetes, wear a medical ID bracelet or chain, and carry a card that describes your disease and details of your medicine and dosage times. This medicine may cause a decrease in folic acid. You should make sure that you get enough folic acid while you are taking this medicine. Discuss the foods you eat and the vitamins you take with your health care professional. What side effects may I notice from receiving this medicine? Side effects that you should report to your doctor or health care professional as soon as possible:  allergic reactions like skin rash, itching or hives, swelling of the face, lips, or tongue  bloody or black, tarry stools  breathing problems  muscle pain  nausea, vomiting  severe stomach pain Side effects that usually do not require medical attention (report to your  doctor or health care professional if they continue or are bothersome):  heartburn or indigestion  stomach upset This list may not describe all possible side effects. Call your doctor for medical advice about side effects. You may report side effects to FDA at 1-800-FDA-1088. Where should I keep my medicine? Keep out of the reach of children. Store at room temperature between 15 and 30 degrees C (59 and 86 degrees F). Protect from moisture. Throw away any unused medicine after the expiration date. NOTE: This sheet is a summary. It may not cover all possible information. If you have questions about this medicine, talk to your doctor, pharmacist, or health care provider.  2020 Elsevier/Gold Standard (2017-07-04 15:44:38) Ezetimibe Tablets What is this medicine? EZETIMIBE (ez ET i mibe) blocks the absorption of cholesterol from the stomach. It can help lower blood cholesterol for patients who  are at risk of getting heart disease or a stroke. It is only for patients whose cholesterol level is not controlled by diet. This medicine may be used for other purposes; ask your health care provider or pharmacist if you have questions. COMMON BRAND NAME(S): Zetia What should I tell my health care provider before I take this medicine? They need to know if you have any of these conditions:  liver disease  an unusual or allergic reaction to ezetimibe, medicines, foods, dyes, or preservatives  pregnant or trying to get pregnant  breast-feeding How should I use this medicine? Take this medicine by mouth with a glass of water. Follow the directions on the prescription label. This medicine can be taken with or without food. Take your doses at regular intervals. Do not take your medicine more often than directed. Talk to your pediatrician regarding the use of this medicine in children. Special care may be needed. Overdosage: If you think you have taken too much of this medicine contact a poison control  center or emergency room at once. NOTE: This medicine is only for you. Do not share this medicine with others. What if I miss a dose? If you miss a dose, take it as soon as you can. If it is almost time for your next dose, take only that dose. Do not take double or extra doses. What may interact with this medicine? Do not take this medicine with any of the following medications:  fenofibrate  gemfibrozil This medicine may also interact with the following medications:  antacids  cyclosporine  herbal medicines like red yeast rice  other medicines to lower cholesterol or triglycerides This list may not describe all possible interactions. Give your health care provider a list of all the medicines, herbs, non-prescription drugs, or dietary supplements you use. Also tell them if you smoke, drink alcohol, or use illegal drugs. Some items may interact with your medicine. What should I watch for while using this medicine? Visit your doctor or health care professional for regular checks on your progress. You will need to have your cholesterol levels checked. If you are also taking some other cholesterol medicines, you will also need to have tests to make sure your liver is working properly. Tell your doctor or health care professional if you get any unexplained muscle pain, tenderness, or weakness, especially if you also have a fever and tiredness. You need to follow a low-cholesterol, low-fat diet while you are taking this medicine. This will decrease your risk of getting heart and blood vessel disease. Exercising and avoiding alcohol and smoking can also help. Ask your doctor or dietician for advice. What side effects may I notice from receiving this medicine? Side effects that you should report to your doctor or health care professional as soon as possible:  allergic reactions like skin rash, itching or hives, swelling of the face, lips, or tongue  dark yellow or brown urine  unusually weak  or tired  yellowing of the skin or eyes Side effects that usually do not require medical attention (report to your doctor or health care professional if they continue or are bothersome):  diarrhea  dizziness  headache  stomach upset or pain This list may not describe all possible side effects. Call your doctor for medical advice about side effects. You may report side effects to FDA at 1-800-FDA-1088. Where should I keep my medicine? Keep out of the reach of children. Store at room temperature between 15 and 30 degrees C (59 and  86 degrees F). Protect from moisture. Keep container tightly closed. Throw away any unused medicine after the expiration date. NOTE: This sheet is a summary. It may not cover all possible information. If you have questions about this medicine, talk to your doctor, pharmacist, or health care provider.  2020 Elsevier/Gold Standard (2012-05-22 15:39:09)

## 2019-06-18 NOTE — Progress Notes (Signed)
Subjective  Chief Complaint  Patient presents with  . Annual Exam    Non fasting    HPI: Eugene Mcdaniel is a 68 y.o. male who presents to [facility] [place] today for a Male Wellness Visit. He also has the concerns and/or needs as listed above in the chief complaint. These will be addressed in addition to the Health Maintenance Visit.   Wellness Visit: annual visit with health maintenance review and exam    Health maintenance: Screens are up-to-date.  Continues healthy lifestyle.  Overall is doing very well.  He is due for Prevnar. Lifestyle: Body mass index is 24.97 kg/m. Wt Readings from Last 3 Encounters:  06/18/19 174 lb (78.9 kg)  10/31/18 179 lb 6.4 oz (81.4 kg)  06/30/18 178 lb 9.6 oz (81 kg)     Chronic disease management visit and/or acute problem visit:  Familial hyperlipidemia with refusal of statin due to myalgias: Continues to decline further evaluation and treatment.  He has never taken Zetia or WelChol.  He is willing to investigate those medications on his own.  He denies chest pain or symptoms of heart disease or heart failure  Small ventral hernia without pain, monitoring  Skin lesions scalp red and flaky.  Has not seen dermatology in many years  Sleep apnea on CPAP, stable  Patient Active Problem List   Diagnosis Date Noted  . OSA on CPAP 01/17/2018    Priority: High  . Refusal of statin medication by patient 12/27/2017    Priority: High  . Dyspepsia 06/13/2017    Priority: High  . Gastroesophageal reflux disease without esophagitis 03/28/2017    Priority: High  . Familial hyperlipidemia 03/28/2017    Priority: High  . Hyperplastic colon polyp 12/28/2017    Priority: Medium  . Constipation 06/15/2017    Priority: Medium  . BPH with urinary obstruction 03/28/2017    Priority: Medium  . Primary osteoarthritis involving multiple joints 03/28/2017    Priority: Medium  . Seasonal allergic rhinitis due to pollen 03/28/2017   Priority: Low  . Tendinitis of both rotator cuffs 10/21/2015    Priority: Low  . Umbilical hernia without obstruction and without gangrene 06/18/2019  . Globus pharyngeus 05/25/2019  . Rhinitis, chronic 01/17/2018  . 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 PNA vac Low Risk Adult (1 of 2 - PCV13) 05/13/2015  . TETANUS/TDAP  02/27/2026  . COLONOSCOPY  09/10/2027  . Hepatitis C Screening  Completed  . INFLUENZA VACCINE  Discontinued   Immunization History  Administered Date(s) Administered  . Tdap 02/28/2016  . Zoster Recombinat (Shingrix) 05/19/2018, 07/20/2018, 08/07/2018   We updated and reviewed the patient's past history in detail and it is documented below. Allergies: Patient is allergic to atorvastatin and simvastatin. Past Medical History  has a past medical history of Allergy, Colon polyp, GERD (gastroesophageal reflux disease), Hypercholesterolemia, Hyperplastic colon polyp (12/28/2017), Primary osteoarthritis involving multiple joints, Seasonal allergic rhinitis due to pollen, and Sleep apnea. Past Surgical History Patient  has a past surgical history that includes Appendectomy; colonoscopy  (06/2005); Tonsillectomy and adenoidectomy (1962); and Medial collateral ligament repair, knee (Right). Social History Patient  reports that he quit smoking about 32 years ago. His smoking use included cigarettes. He has a 60.00 pack-year smoking history. He has quit using smokeless tobacco. He reports current alcohol use. He reports that he does not use drugs. Family History family history includes Arthritis in his father;  Healthy in his daughter and sister; Heart disease in his father; Lung cancer in his mother. Review of Systems: Constitutional: negative for fever or malaise Ophthalmic: negative for photophobia, double vision or loss of vision Cardiovascular: negative for chest pain, dyspnea on exertion, or new LE swelling Respiratory: negative  for SOB or persistent cough Gastrointestinal: negative for abdominal pain, change in bowel habits or melena Genitourinary: negative for dysuria or gross hematuria Musculoskeletal: negative for new gait disturbance or muscular weakness Integumentary: negative for new or persistent rashes Neurological: negative for TIA or stroke symptoms Psychiatric: negative for SI or delusions Allergic/Immunologic: negative for hives  Patient Care Team    Relationship Specialty Notifications Start End  Leamon Arnt, MD PCP - General Family Medicine  12/27/17   Irine Seal, MD Attending Physician Urology  12/27/17   Breck Coons, MD Consulting Physician Gastroenterology  12/28/17   Marius Ditch, MD Attending Physician Pulmonary Disease  06/18/19   Jerrell Belfast, MD Consulting Physician Otolaryngology  06/18/19    Objective  Vitals: BP 126/72   Pulse 65   Temp 98.2 F (36.8 C) (Oral)   Resp 16   Ht 5\' 10"  (1.778 m)   Wt 174 lb (78.9 kg)   SpO2 93%   BMI 24.97 kg/m  General:  Well developed, well nourished, no acute distress  Psych:  Alert and orientedx3,normal mood and affect HEENT:  Normocephalic, atraumatic, non-icteric sclera, PERRL, oropharynx is clear without mass or exudate, supple neck without adenopathy, mass or thyromegaly Cardiovascular:  Normal S1, S2, RRR without gallop, rub or murmur, nondisplaced PMI, +2 distal pulses in bilateral upper and lower extremities. Respiratory:  Good breath sounds bilaterally, CTAB with normal respiratory effort Gastrointestinal: normal bowel sounds, soft, non-tender, no noted masses. No HSM small umbilical hernia nontender, easily reducible MSK: no deformities, contusions. Joints are without erythema or swelling. Spine and CVA region are nontender Skin:  Warm, 2 mm flaking firm lesion on the top of his scalp, 3 to 4 mm flaking erythematous lesion above right temporal area.   Neurologic:    Mental status is normal. CN 2-11 are normal. Gross motor and  sensory exams are normal. Stable gait. No tremor GU: No inguinal hernias or adenopathy are appreciated bilaterally   Cryotherapy Procedure Note  Pre-operative Diagnosis: Actinic keratosis  Post-operative Diagnosis: Actinic keratosis  Locations: scalp x 2  Indications: precancerous  Anesthesia: none  Procedure Details   Patient informed of risks (permanent scarring, infection, light or dark discoloration, bleeding, infection, weakness, numbness and recurrence of the lesion) and benefits of the procedure and verbal informed consent obtained. Universal time out performed  The areas are treated with liquid nitrogen therapy, frozen until ice ball extended 2 mm beyond lesion, allowed to thaw, and treated again. Repeated twice on both lesions. The patient tolerated procedure well.  The patient was instructed on post-op care, warned that there may be blister formation, redness and pain. Recommend OTC analgesia as needed for pain.  Condition: Stable  Complications: none.  Assessment  1. Annual physical exam   2. Gastroesophageal reflux disease without esophagitis   3. Familial hyperlipidemia   4. Refusal of statin medication by patient   5. Actinic keratoses   6. OSA on CPAP   7. Umbilical hernia without obstruction and without gangrene      Plan  Male Wellness Visit:  Age appropriate Health Maintenance and Prevention measures were discussed with patient. Included topics are cancer screening recommendations, ways to keep healthy (see AVS)  including dietary and exercise recommendations, regular eye and dental care, use of seat belts, and avoidance of moderate alcohol use and tobacco use.   BMI: discussed patient's BMI and encouraged positive lifestyle modifications to help get to or maintain a target BMI.  HM needs and immunizations were addressed and ordered. See below for orders. See HM and immunization section for updates.  Prevnar prescription given today, patient is a pharmacy   Routine labs and screening tests ordered including cmp, cbc and lipids where appropriate.  Discussed recommendations regarding Vit D and calcium supplementation (see AVS)  Chronic disease f/u and/or acute problem visit: (deemed necessary to be done in addition to the wellness visit):  GERD is well controlled on PPI  Hyperlipidemia, familial: Patient to consider non-statin cholesterol-lowering medications.  Refuses lipid screen at this time.  AK: Status post cryotherapy x2.  Routine postprocedure care instructions given.  Follow-up with Derm if persistent  Small mobile hernia: We will monitor.  No further intervention needed at this time.  Sleep apnea is well controlled on pulmonology with CPAP.  Follow up: Return in about 1 year (around 06/17/2020) for complete physical.   Commons side effects, risks, benefits, and alternatives for medications and treatment plan prescribed today were discussed, and the patient expressed understanding of the given instructions. Patient is instructed to call or message via MyChart if he/she has any questions or concerns regarding our treatment plan. No barriers to understanding were identified. We discussed Red Flag symptoms and signs in detail. Patient expressed understanding regarding what to do in case of urgent or emergency type symptoms.   Medication list was reconciled, printed and provided to the patient in AVS. Patient instructions and summary information was reviewed with the patient as documented in the AVS. This note was prepared with assistance of Dragon voice recognition software. Occasional wrong-word or sound-a-like substitutions may have occurred due to the inherent limitations of voice recognition software  Orders Placed This Encounter  Procedures  . CBC with Differential/Platelet  . Comprehensive metabolic panel   Meds ordered this encounter  Medications  . pneumococcal 13-valent conjugate vaccine (PREVNAR 13) SUSP injection    Sig:  Inject 0.5 mLs into the muscle once for 1 dose.    Dispense:  0.5 mL    Refill:  0

## 2019-09-25 DIAGNOSIS — G4733 Obstructive sleep apnea (adult) (pediatric): Secondary | ICD-10-CM | POA: Diagnosis not present

## 2019-09-25 DIAGNOSIS — Z9989 Dependence on other enabling machines and devices: Secondary | ICD-10-CM | POA: Diagnosis not present

## 2019-09-25 DIAGNOSIS — J343 Hypertrophy of nasal turbinates: Secondary | ICD-10-CM | POA: Diagnosis not present

## 2019-09-25 DIAGNOSIS — K219 Gastro-esophageal reflux disease without esophagitis: Secondary | ICD-10-CM | POA: Diagnosis not present

## 2019-09-25 DIAGNOSIS — J342 Deviated nasal septum: Secondary | ICD-10-CM | POA: Diagnosis not present

## 2019-11-28 DIAGNOSIS — Z85828 Personal history of other malignant neoplasm of skin: Secondary | ICD-10-CM | POA: Diagnosis not present

## 2019-11-28 DIAGNOSIS — D1801 Hemangioma of skin and subcutaneous tissue: Secondary | ICD-10-CM | POA: Diagnosis not present

## 2019-11-28 DIAGNOSIS — L821 Other seborrheic keratosis: Secondary | ICD-10-CM | POA: Diagnosis not present

## 2019-11-28 DIAGNOSIS — L570 Actinic keratosis: Secondary | ICD-10-CM | POA: Diagnosis not present

## 2019-11-28 DIAGNOSIS — D0472 Carcinoma in situ of skin of left lower limb, including hip: Secondary | ICD-10-CM | POA: Diagnosis not present

## 2019-11-28 DIAGNOSIS — L720 Epidermal cyst: Secondary | ICD-10-CM | POA: Diagnosis not present

## 2019-11-28 DIAGNOSIS — L814 Other melanin hyperpigmentation: Secondary | ICD-10-CM | POA: Diagnosis not present

## 2019-12-11 ENCOUNTER — Telehealth: Payer: Self-pay | Admitting: Family Medicine

## 2019-12-11 NOTE — Telephone Encounter (Signed)
Left message for patient to call back and schedule Medicare Annual Wellness Visit (AWV) either virtually/audio only OR in office. Whatever the patients preference is.  No previous hx; please schedule at anytime with [facility]Nurse Health Advisor at [facility].  OK for PEC to schedule

## 2019-12-28 ENCOUNTER — Encounter: Payer: Self-pay | Admitting: Family Medicine

## 2020-01-16 DIAGNOSIS — G4733 Obstructive sleep apnea (adult) (pediatric): Secondary | ICD-10-CM | POA: Diagnosis not present

## 2020-01-23 DIAGNOSIS — H52203 Unspecified astigmatism, bilateral: Secondary | ICD-10-CM | POA: Diagnosis not present

## 2020-01-23 DIAGNOSIS — H2513 Age-related nuclear cataract, bilateral: Secondary | ICD-10-CM | POA: Diagnosis not present

## 2020-02-18 ENCOUNTER — Other Ambulatory Visit: Payer: Self-pay | Admitting: Family Medicine

## 2020-03-12 DIAGNOSIS — R350 Frequency of micturition: Secondary | ICD-10-CM | POA: Diagnosis not present

## 2020-04-22 DIAGNOSIS — R7301 Impaired fasting glucose: Secondary | ICD-10-CM | POA: Diagnosis not present

## 2020-04-22 DIAGNOSIS — E7211 Homocystinuria: Secondary | ICD-10-CM | POA: Diagnosis not present

## 2020-04-22 DIAGNOSIS — D8940 Mast cell activation, unspecified: Secondary | ICD-10-CM | POA: Diagnosis not present

## 2020-04-22 DIAGNOSIS — E063 Autoimmune thyroiditis: Secondary | ICD-10-CM | POA: Diagnosis not present

## 2020-04-22 DIAGNOSIS — E559 Vitamin D deficiency, unspecified: Secondary | ICD-10-CM | POA: Diagnosis not present

## 2020-04-22 DIAGNOSIS — K2100 Gastro-esophageal reflux disease with esophagitis, without bleeding: Secondary | ICD-10-CM | POA: Diagnosis not present

## 2020-04-22 DIAGNOSIS — D508 Other iron deficiency anemias: Secondary | ICD-10-CM | POA: Diagnosis not present

## 2020-04-22 DIAGNOSIS — J3089 Other allergic rhinitis: Secondary | ICD-10-CM | POA: Diagnosis not present

## 2020-04-22 DIAGNOSIS — E039 Hypothyroidism, unspecified: Secondary | ICD-10-CM | POA: Diagnosis not present

## 2020-04-22 DIAGNOSIS — E7889 Other lipoprotein metabolism disorders: Secondary | ICD-10-CM | POA: Diagnosis not present

## 2020-06-19 DIAGNOSIS — G43909 Migraine, unspecified, not intractable, without status migrainosus: Secondary | ICD-10-CM | POA: Diagnosis not present

## 2020-06-23 ENCOUNTER — Encounter: Payer: Medicare Other | Admitting: Family Medicine

## 2020-06-30 ENCOUNTER — Telehealth: Payer: Self-pay | Admitting: Family Medicine

## 2020-06-30 NOTE — Telephone Encounter (Signed)
LM for pt to call back and reschedule AWV with Nurse Health Advisor due to being out of office.

## 2020-07-08 ENCOUNTER — Ambulatory Visit: Payer: Medicare Other

## 2020-07-08 ENCOUNTER — Encounter: Payer: Medicare Other | Admitting: Family Medicine

## 2020-07-28 DIAGNOSIS — L570 Actinic keratosis: Secondary | ICD-10-CM | POA: Diagnosis not present

## 2020-07-28 DIAGNOSIS — Z85828 Personal history of other malignant neoplasm of skin: Secondary | ICD-10-CM | POA: Diagnosis not present

## 2020-07-28 DIAGNOSIS — C4442 Squamous cell carcinoma of skin of scalp and neck: Secondary | ICD-10-CM | POA: Diagnosis not present

## 2020-07-28 DIAGNOSIS — L720 Epidermal cyst: Secondary | ICD-10-CM | POA: Diagnosis not present

## 2020-07-28 DIAGNOSIS — L814 Other melanin hyperpigmentation: Secondary | ICD-10-CM | POA: Diagnosis not present

## 2020-08-05 DIAGNOSIS — R7303 Prediabetes: Secondary | ICD-10-CM | POA: Diagnosis not present

## 2020-08-05 DIAGNOSIS — B3789 Other sites of candidiasis: Secondary | ICD-10-CM | POA: Diagnosis not present

## 2020-08-05 DIAGNOSIS — R799 Abnormal finding of blood chemistry, unspecified: Secondary | ICD-10-CM | POA: Diagnosis not present

## 2020-08-05 DIAGNOSIS — E782 Mixed hyperlipidemia: Secondary | ICD-10-CM | POA: Diagnosis not present

## 2020-08-05 DIAGNOSIS — B3782 Candidal enteritis: Secondary | ICD-10-CM | POA: Diagnosis not present

## 2020-08-06 DIAGNOSIS — G4733 Obstructive sleep apnea (adult) (pediatric): Secondary | ICD-10-CM | POA: Diagnosis not present

## 2020-08-12 ENCOUNTER — Encounter: Payer: Self-pay | Admitting: Family Medicine

## 2020-08-12 ENCOUNTER — Ambulatory Visit (INDEPENDENT_AMBULATORY_CARE_PROVIDER_SITE_OTHER): Payer: Medicare Other | Admitting: Family Medicine

## 2020-08-12 ENCOUNTER — Other Ambulatory Visit: Payer: Self-pay

## 2020-08-12 VITALS — BP 116/72 | HR 71 | Temp 98.0°F | Ht 70.0 in | Wt 165.6 lb

## 2020-08-12 DIAGNOSIS — E7849 Other hyperlipidemia: Secondary | ICD-10-CM

## 2020-08-12 DIAGNOSIS — Z0000 Encounter for general adult medical examination without abnormal findings: Secondary | ICD-10-CM

## 2020-08-12 DIAGNOSIS — N401 Enlarged prostate with lower urinary tract symptoms: Secondary | ICD-10-CM | POA: Diagnosis not present

## 2020-08-12 DIAGNOSIS — M791 Myalgia, unspecified site: Secondary | ICD-10-CM | POA: Diagnosis not present

## 2020-08-12 DIAGNOSIS — K219 Gastro-esophageal reflux disease without esophagitis: Secondary | ICD-10-CM | POA: Diagnosis not present

## 2020-08-12 DIAGNOSIS — T466X5A Adverse effect of antihyperlipidemic and antiarteriosclerotic drugs, initial encounter: Secondary | ICD-10-CM

## 2020-08-12 DIAGNOSIS — Z23 Encounter for immunization: Secondary | ICD-10-CM

## 2020-08-12 DIAGNOSIS — Z9989 Dependence on other enabling machines and devices: Secondary | ICD-10-CM

## 2020-08-12 DIAGNOSIS — G4733 Obstructive sleep apnea (adult) (pediatric): Secondary | ICD-10-CM

## 2020-08-12 DIAGNOSIS — J301 Allergic rhinitis due to pollen: Secondary | ICD-10-CM

## 2020-08-12 DIAGNOSIS — N138 Other obstructive and reflux uropathy: Secondary | ICD-10-CM

## 2020-08-12 LAB — CBC WITH DIFFERENTIAL/PLATELET
Absolute Monocytes: 277 cells/uL (ref 200–950)
Basophils Absolute: 22 cells/uL (ref 0–200)
Basophils Relative: 0.6 %
Eosinophils Absolute: 40 cells/uL (ref 15–500)
Eosinophils Relative: 1.1 %
HCT: 41.7 % (ref 38.5–50.0)
Hemoglobin: 14 g/dL (ref 13.2–17.1)
Lymphs Abs: 1202 cells/uL (ref 850–3900)
MCH: 32.1 pg (ref 27.0–33.0)
MCHC: 33.6 g/dL (ref 32.0–36.0)
MCV: 95.6 fL (ref 80.0–100.0)
MPV: 11.5 fL (ref 7.5–12.5)
Monocytes Relative: 7.7 %
Neutro Abs: 2059 cells/uL (ref 1500–7800)
Neutrophils Relative %: 57.2 %
Platelets: 196 10*3/uL (ref 140–400)
RBC: 4.36 10*6/uL (ref 4.20–5.80)
RDW: 13 % (ref 11.0–15.0)
Total Lymphocyte: 33.4 %
WBC: 3.6 10*3/uL — ABNORMAL LOW (ref 3.8–10.8)

## 2020-08-12 LAB — COMPLETE METABOLIC PANEL WITH GFR
AG Ratio: 2.2 (calc) (ref 1.0–2.5)
ALT: 21 U/L (ref 9–46)
AST: 19 U/L (ref 10–35)
Albumin: 4.1 g/dL (ref 3.6–5.1)
Alkaline phosphatase (APISO): 37 U/L (ref 35–144)
BUN: 21 mg/dL (ref 7–25)
CO2: 25 mmol/L (ref 20–32)
Calcium: 9.2 mg/dL (ref 8.6–10.3)
Chloride: 108 mmol/L (ref 98–110)
Creat: 1.01 mg/dL (ref 0.70–1.18)
GFR, Est African American: 87 mL/min/{1.73_m2} (ref >=60)
GFR, Est Non African American: 75 mL/min/{1.73_m2} (ref >=60)
Globulin: 1.9 g/dL (calc) (ref 1.9–3.7)
Glucose, Bld: 100 mg/dL — ABNORMAL HIGH (ref 65–99)
Potassium: 4.3 mmol/L (ref 3.5–5.3)
Sodium: 139 mmol/L (ref 135–146)
Total Bilirubin: 0.6 mg/dL (ref 0.2–1.2)
Total Protein: 6 g/dL — ABNORMAL LOW (ref 6.1–8.1)

## 2020-08-12 NOTE — Addendum Note (Signed)
Addended by: Doran Clay A on: 08/12/2020 09:54 AM   Modules accepted: Orders

## 2020-08-12 NOTE — Patient Instructions (Signed)
Please return in 12 months for your annual complete physical; please come fasting.  I will release your lab results to you on your MyChart account with further instructions. Please reply with any questions.   Today you were given your Pneumovac vaccination. This protects your from getting pneumococcal pneumonia.   If you have any questions or concerns, please don't hesitate to send me a message via MyChart or call the office at [phone]. Thank you for visiting with [place] today! It's our pleasure caring for you.  Please do these things to maintain good health!   Exercise at least 30-45 minutes a day,  4-5 days a week.   Eat a low-fat diet with lots of fruits and vegetables, up to 7-9 servings per day.  Drink plenty of water daily. Try to drink 8 8oz glasses per day.  Seatbelts can save your life. Always wear your seatbelt.  Place Smoke Detectors on every level of your home and check batteries every year.  Eye Doctor - have an eye exam every 1-2 years  Safe sex - use condoms to protect yourself from STDs if you could be exposed to these types of infections.  Avoid heavy alcohol use. If you drink, keep it to less than 2 drinks/day and not every day.  [place].  Choose someone you trust that could speak for you if you became unable to speak for yourself.  Depression is common in our stressful world.If you're feeling down or losing interest in things you normally enjoy, please come in for a visit.

## 2020-08-12 NOTE — Progress Notes (Signed)
Subjective  Chief Complaint  Patient presents with  . Annual Exam    non-fasting    HPI: Eugene Mcdaniel is a 69 y.o. male who presents to [facility] [place] today for a Male Wellness Visit. He also has the concerns and/or needs as listed above in the chief complaint. These will be addressed in addition to the Health Maintenance Visit.   Wellness Visit: annual visit with health maintenance review and exam    HM: due pneumovax/prevnar. Due lab work, nonfasting and refusal of lipid f/u. See prior notes/discussion. No changes today. Doing well. Reviewed notes from ENT, ophtho, and urology and derm.   Lifestyle: Body mass index is 23.76 kg/m. Wt Readings from Last 3 Encounters:  08/12/20 165 lb 9.6 oz (75.1 kg)  06/18/19 174 lb (78.9 kg)  10/31/18 179 lb 6.4 oz (81.4 kg)     Chronic disease management visit and/or acute problem visit:  GERD; has lost weight and is no longer having sxs. Stopped ppi. Eating better.  HLD: eating healthy.   osa on cpap stable  Allergies are improved as well. Rare allergy med use now  BPH: stable f/u at urology and increased doxazosin to 8mg  daily.     Patient Active Problem List   Diagnosis Date Noted  . OSA on CPAP 01/17/2018  . Refusal of statin medication by patient 12/27/2017  . Dyspepsia 06/13/2017  . Gastroesophageal reflux disease without esophagitis 03/28/2017  . Familial hyperlipidemia 03/28/2017  . Myalgia due to statin 08/12/2020  . Hyperplastic colon polyp 12/28/2017  . Constipation 06/15/2017  . BPH with urinary obstruction 03/28/2017  . Primary osteoarthritis involving multiple joints 03/28/2017  . Umbilical hernia without obstruction and without gangrene 06/18/2019  . Rhinitis, chronic 01/17/2018  . Seasonal allergic rhinitis due to pollen 03/28/2017  . Tendinitis of both rotator cuffs 10/21/2015  . Globus pharyngeus 05/25/2019  . 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 PNA vac Low Risk Adult (1 of 2 - PCV13) Never done  . TETANUS/TDAP  02/27/2026  . COLONOSCOPY  09/10/2027  . COVID-19 Vaccine  Completed  . Hepatitis C Screening  Completed  . INFLUENZA VACCINE  Discontinued   Immunization History  Administered Date(s) Administered  . Moderna SARS-COVID-2 Vaccination 02/09/2020, 03/11/2020  . Tdap 02/28/2016  . Zoster Recombinat (Shingrix) 05/19/2018, 07/20/2018, 08/07/2018   We updated and reviewed the patient's past history in detail and it is documented below. Allergies: Patient is allergic to atorvastatin and simvastatin. Past Medical History  has a past medical history of Allergy, Colon polyp, GERD (gastroesophageal reflux disease), Hypercholesterolemia, Hyperplastic colon polyp (12/28/2017), Primary osteoarthritis involving multiple joints, Seasonal allergic rhinitis due to pollen, and Sleep apnea. Past Surgical History Patient  has a past surgical history that includes Appendectomy; colonoscopy  (06/2005); Tonsillectomy and adenoidectomy (1962); and Medial collateral ligament repair, knee (Right). Social History Patient  reports that he quit smoking about 33 years ago. His smoking use included cigarettes. He has a 60.00 pack-year smoking history. He has quit using smokeless tobacco. He reports current alcohol use. He reports that he does not use drugs. Family History family history includes Arthritis in his father; Healthy in his daughter and sister; Heart disease in his father; Lung cancer in his mother. Review of Systems: Constitutional: negative for fever or malaise Ophthalmic: negative for photophobia, double vision or loss of vision Cardiovascular: negative for chest pain, dyspnea on exertion, or new LE swelling Respiratory: negative  for SOB or persistent cough Gastrointestinal: negative for abdominal pain, change in bowel habits or melena Genitourinary: negative for dysuria or gross hematuria Musculoskeletal: negative for new  gait disturbance or muscular weakness Integumentary: negative for new or persistent rashes Neurological: negative for TIA or stroke symptoms Psychiatric: negative for SI or delusions Allergic/Immunologic: negative for hives  Patient Care Team    Relationship Specialty Notifications Start End  Leamon Arnt, MD PCP - General Family Medicine  12/27/17   Irine Seal, MD Attending Physician Urology  12/27/17   Breck Coons, MD Consulting Physician Gastroenterology  12/28/17   Marius Ditch, MD Attending Physician Pulmonary Disease  06/18/19   Jerrell Belfast, MD Consulting Physician Otolaryngology  03/17/36   Modena Nunnery, MD Referring Physician Family Medicine  08/12/20   Harriett Sine, MD Consulting Physician Dermatology  08/12/20    Objective  Vitals: BP 116/72   Pulse 71   Temp 98 F (36.7 C) (Temporal)   Ht 5\' 10"  (1.778 m)   Wt 165 lb 9.6 oz (75.1 kg)   SpO2 97%   BMI 23.76 kg/m  General:  Well developed, well nourished, no acute distress  Psych:  Alert and orientedx3,normal mood and affect HEENT:  Normocephalic, atraumatic, non-icteric sclera, PERRL, oropharynx is clear without mass or exudate, supple neck without adenopathy, mass or thyromegaly Cardiovascular:  Normal S1, S2, RRR without gallop, rub or murmur, nondisplaced PMI, +2 distal pulses in bilateral upper and lower extremities. Respiratory:  Good breath sounds bilaterally, CTAB with normal respiratory effort Gastrointestinal: normal bowel sounds, soft, non-tender, no noted masses. No HSM MSK: no deformities, contusions. Joints are without erythema or swelling. Spine and CVA region are nontender Skin:  Warm, no rashes or suspicious lesions noted Neurologic:    Mental status is normal. CN 2-11 are normal. Gross motor and sensory exams are normal. Stable gait. Mild tremor GU: No inguinal hernias or adenopathy are appreciated bilaterally   Assessment  1. Annual physical exam   2. Myalgia due to statin   3. BPH  with urinary obstruction   4. Gastroesophageal reflux disease without esophagitis   5. Familial hyperlipidemia   6. Seasonal allergic rhinitis due to pollen   7. OSA on CPAP      Plan  Male Wellness Visit:  Age appropriate Health Maintenance and Prevention measures were discussed with patient. Included topics are cancer screening recommendations, ways to keep healthy (see AVS) including dietary and exercise recommendations, regular eye and dental care, use of seat belts, and avoidance of moderate alcohol use and tobacco use.   BMI: discussed patient's BMI and encouraged positive lifestyle modifications to help get to or maintain a target BMI.  HM needs and immunizations were addressed and ordered. See below for orders. See HM and immunization section for updates. Pneumovax today. prevnar next visit  Routine labs and screening tests ordered including cmp, cbc and lipids where appropriate.  Discussed recommendations regarding Vit D and calcium supplementation (see AVS)  Chronic disease f/u and/or acute problem visit: (deemed necessary to be done in addition to the wellness visit):  Chronic problems are stable and/or improved. Doing well. No changes made today.   Follow up: 12 months for cp   Commons side effects, risks, benefits, and alternatives for medications and treatment plan prescribed today were discussed, and the patient expressed understanding of the given instructions. Patient is instructed to call or message via MyChart if he/she has any questions or concerns regarding our treatment plan. No barriers to understanding  were identified. We discussed Red Flag symptoms and signs in detail. Patient expressed understanding regarding what to do in case of urgent or emergency type symptoms.   Medication list was reconciled, printed and provided to the patient in AVS. Patient instructions and summary information was reviewed with the patient as documented in the AVS. This note was  prepared with assistance of Dragon voice recognition software. Occasional wrong-word or sound-a-like substitutions may have occurred due to the inherent limitations of voice recognition software  This visit occurred during the SARS-CoV-2 public health emergency.  Safety protocols were in place, including screening questions prior to the visit, additional usage of staff PPE, and extensive cleaning of exam room while observing appropriate contact time as indicated for disinfecting solutions.   Orders Placed This Encounter  Procedures  . CBC with Differential/Platelet  . COMPLETE METABOLIC PANEL WITH GFR   No orders of the defined types were placed in this encounter.

## 2020-09-05 DIAGNOSIS — G4733 Obstructive sleep apnea (adult) (pediatric): Secondary | ICD-10-CM | POA: Diagnosis not present

## 2020-10-06 DIAGNOSIS — G4733 Obstructive sleep apnea (adult) (pediatric): Secondary | ICD-10-CM | POA: Diagnosis not present

## 2020-10-13 DIAGNOSIS — G4733 Obstructive sleep apnea (adult) (pediatric): Secondary | ICD-10-CM | POA: Diagnosis not present

## 2020-10-14 ENCOUNTER — Encounter: Payer: Self-pay | Admitting: Family Medicine

## 2020-11-05 DIAGNOSIS — G4733 Obstructive sleep apnea (adult) (pediatric): Secondary | ICD-10-CM | POA: Diagnosis not present

## 2020-11-12 ENCOUNTER — Encounter: Payer: Self-pay | Admitting: Family Medicine

## 2020-11-12 NOTE — Telephone Encounter (Signed)
Please see mychart message and be sure to schedule his friends as new patients with me when they call. Thanks!

## 2020-12-02 IMAGING — DX DG HAND COMPLETE 3+V*R*
3 series · 3 of 3 positions shown · non-contrast
Comparison: None.

CLINICAL DATA: Primary osteoarthritis of both hands.

EXAM:
RIGHT HAND - COMPLETE 3+ VIEW

[hand pa]
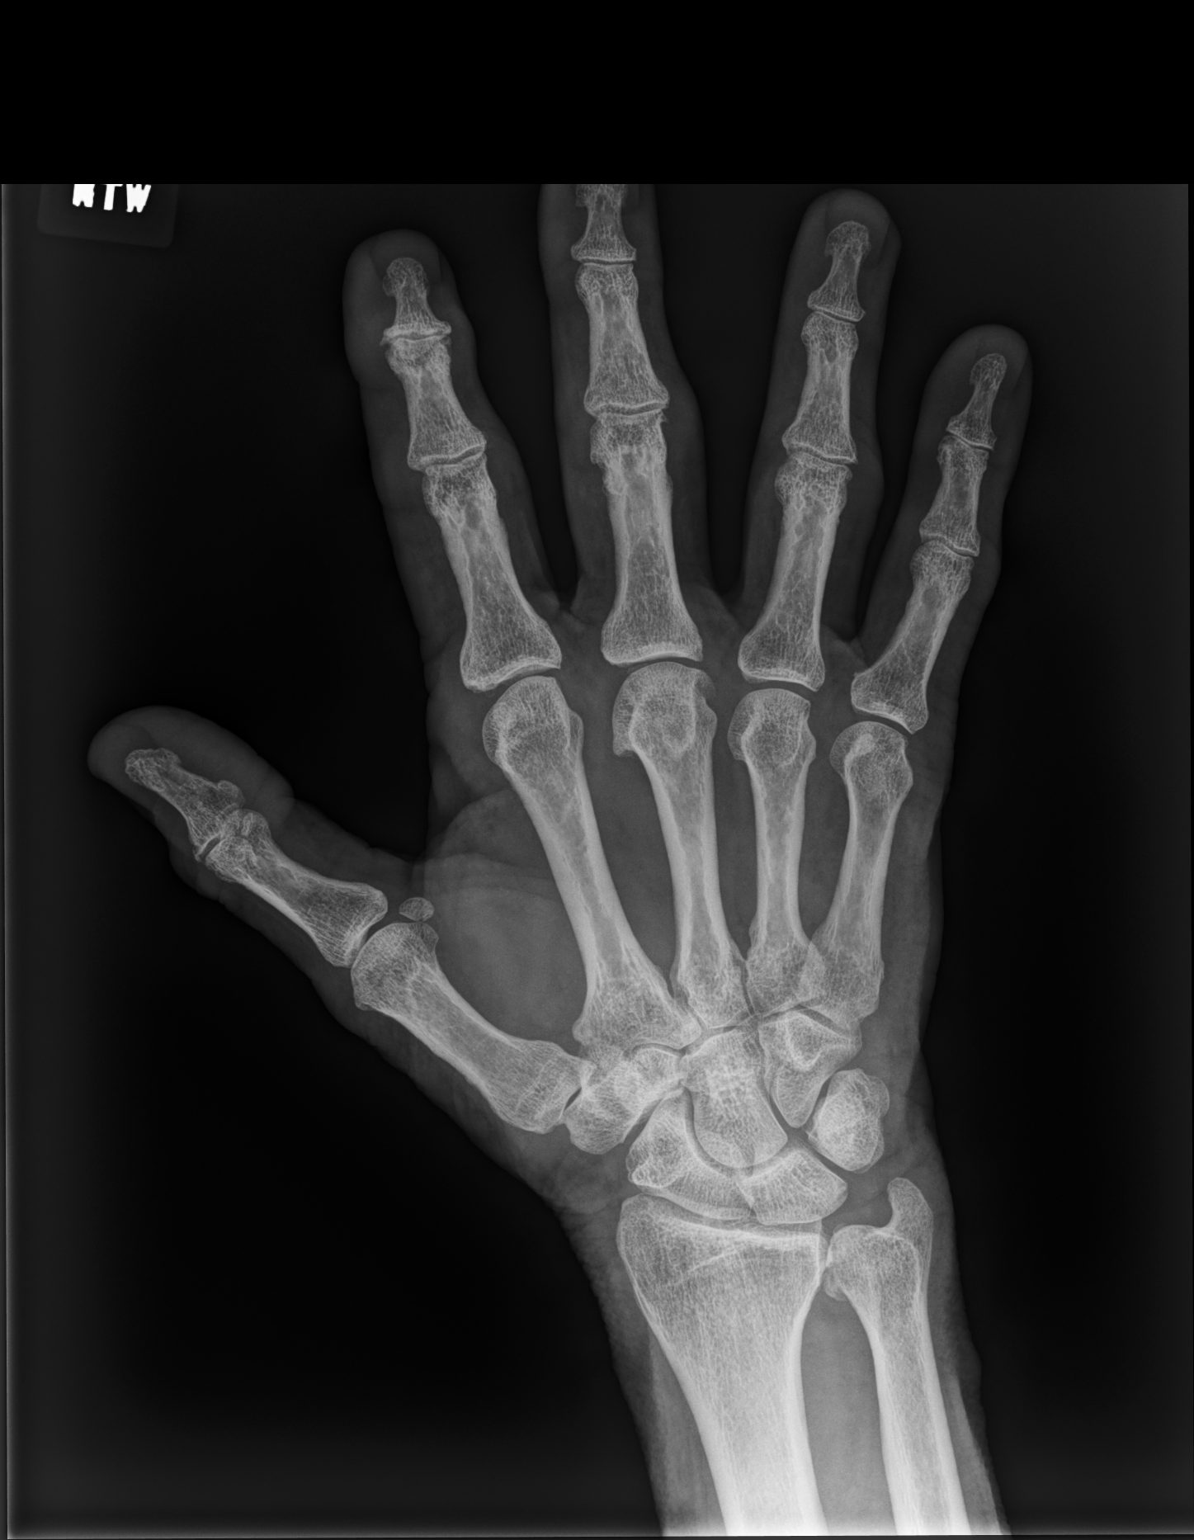

[hand oblique]
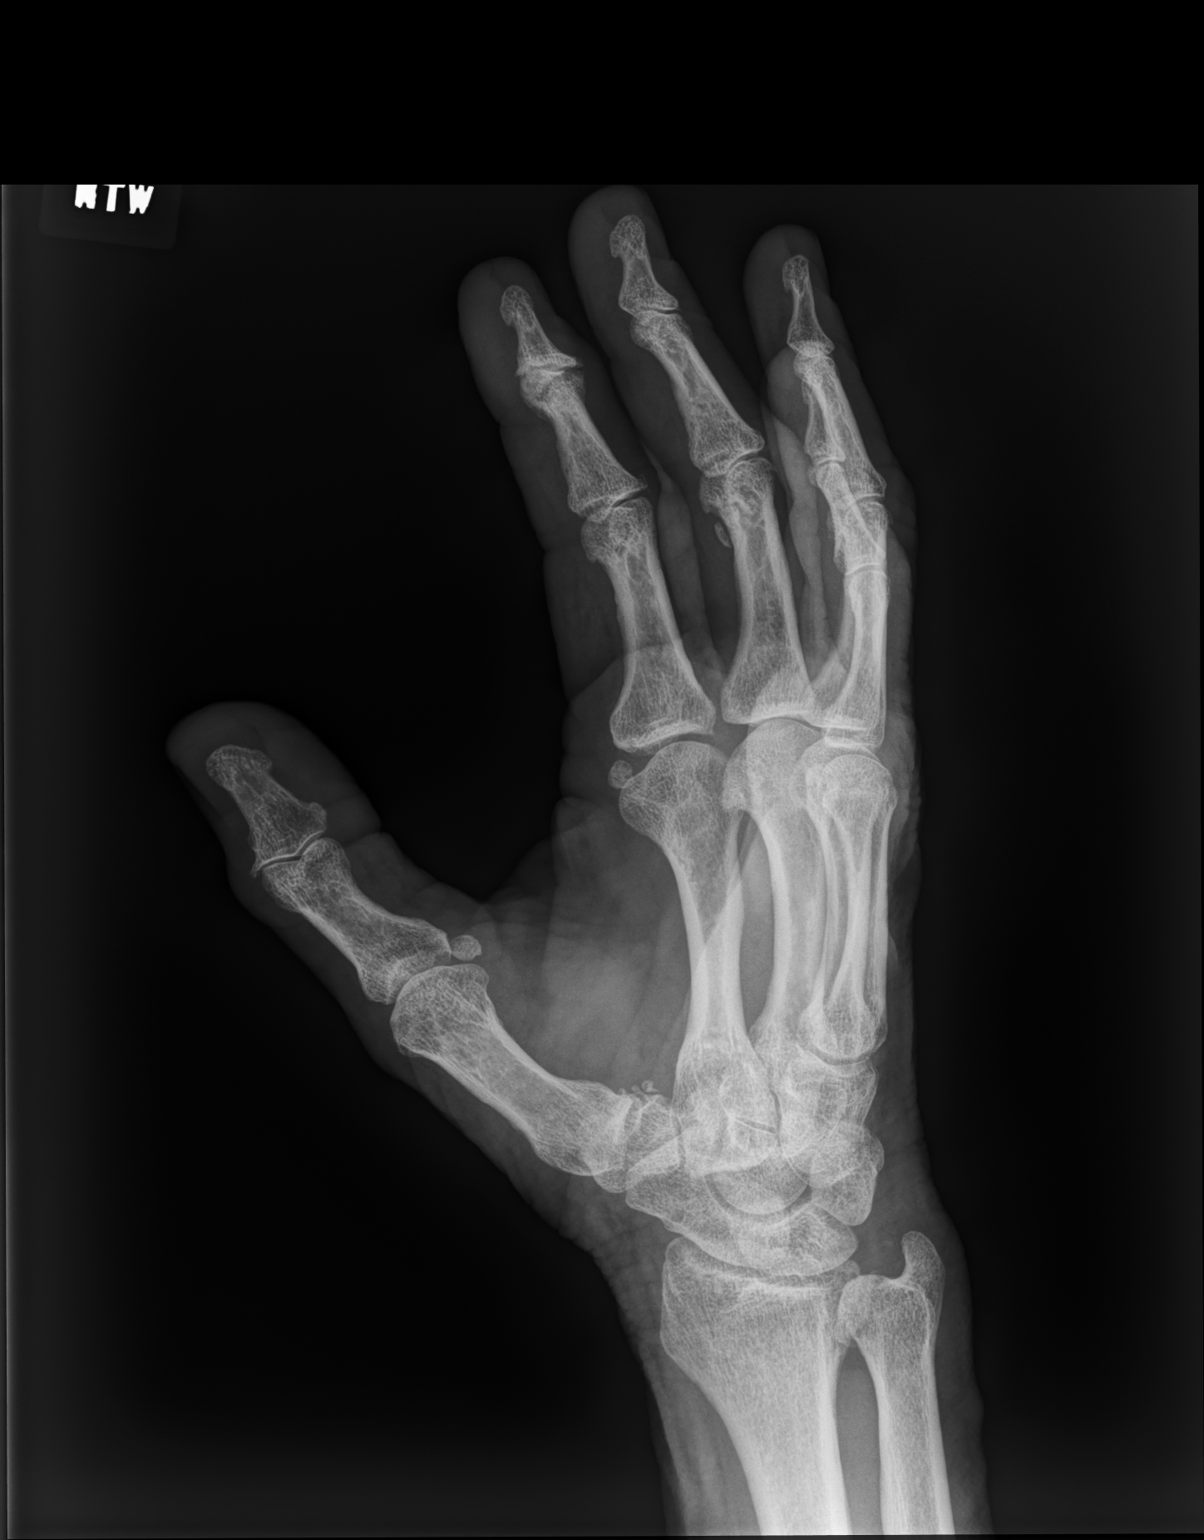

[hand lat]
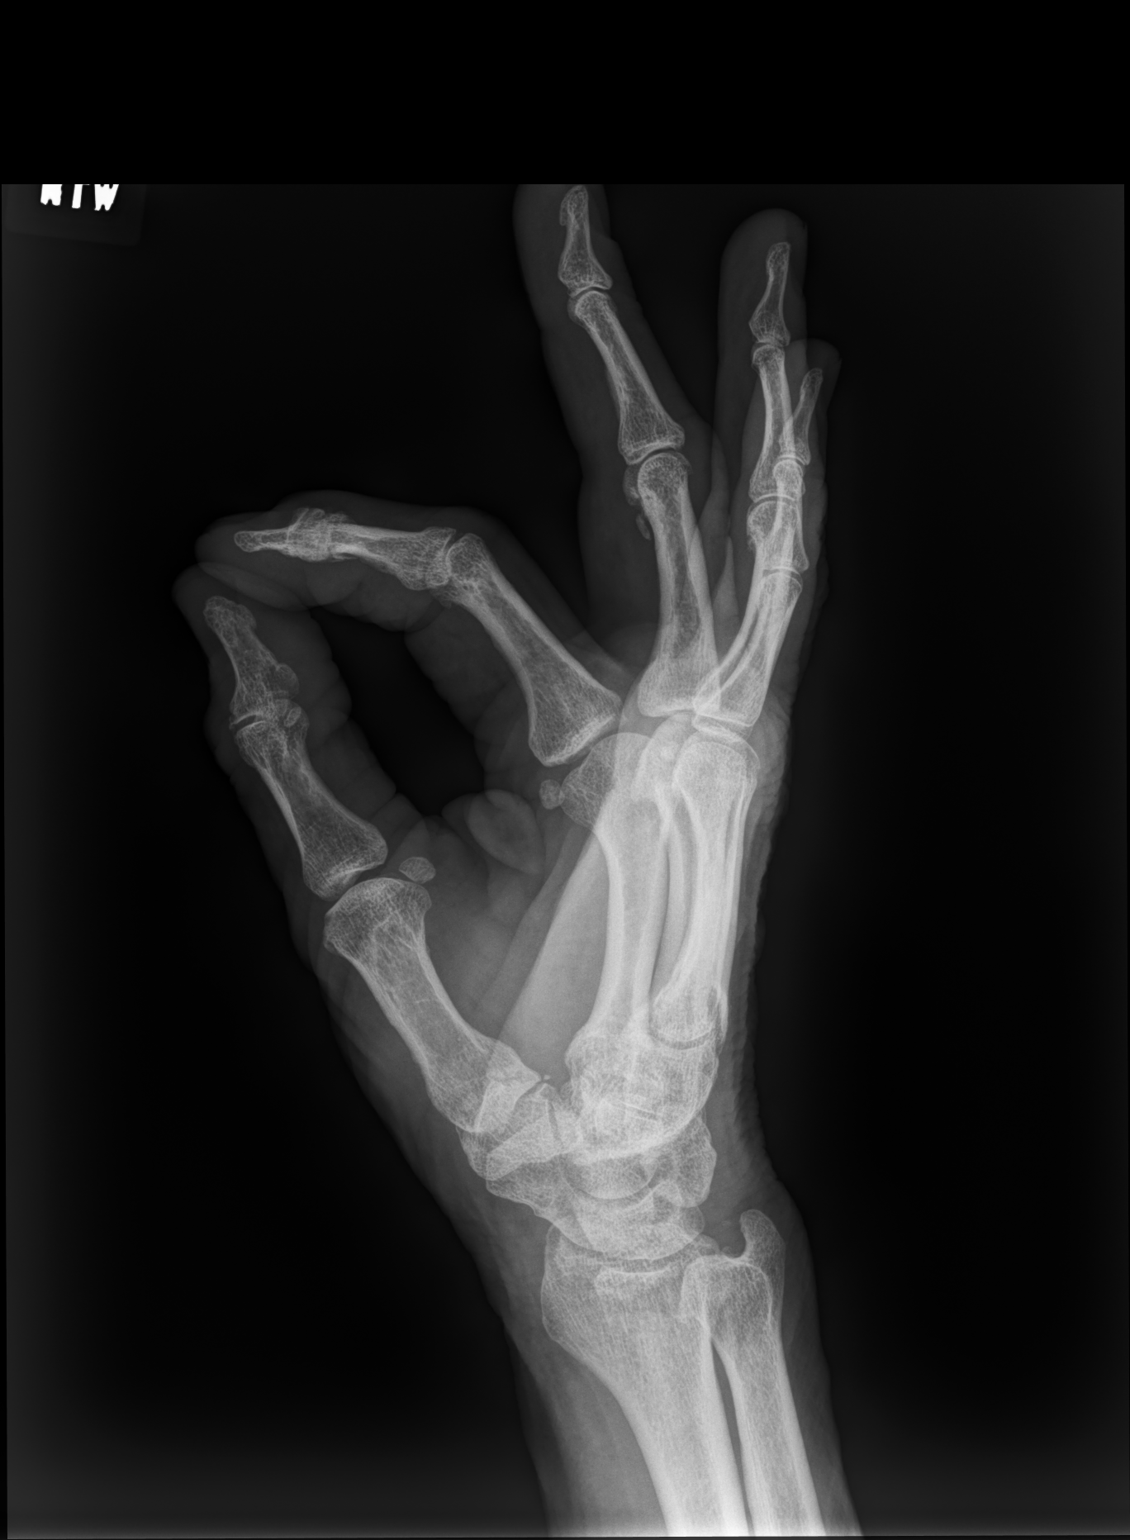

[3 of 3 positions shown; findings below may reference images not displayed]

FINDINGS: There is no evidence of fracture or dislocation. Moderate narrowing
of first carpometacarpal joint is noted. Also noted is narrowing and
osteophyte formation involving the distal interphalangeal joint of
the index finger.. Soft tissues are unremarkable.
IMPRESSION: Findings consistent with osteoarthritis of the first carpometacarpal
joint as well as the distal interphalangeal joint of the index
finger. No acute abnormality seen in the right hand.

## 2020-12-02 IMAGING — DX DG HAND COMPLETE 3+V*L*
3 series · 3 of 3 positions shown · non-contrast
Comparison: None.

CLINICAL DATA: Primary osteoarthritis of both hands.

EXAM:
LEFT HAND - COMPLETE 3+ VIEW

[hand pa]
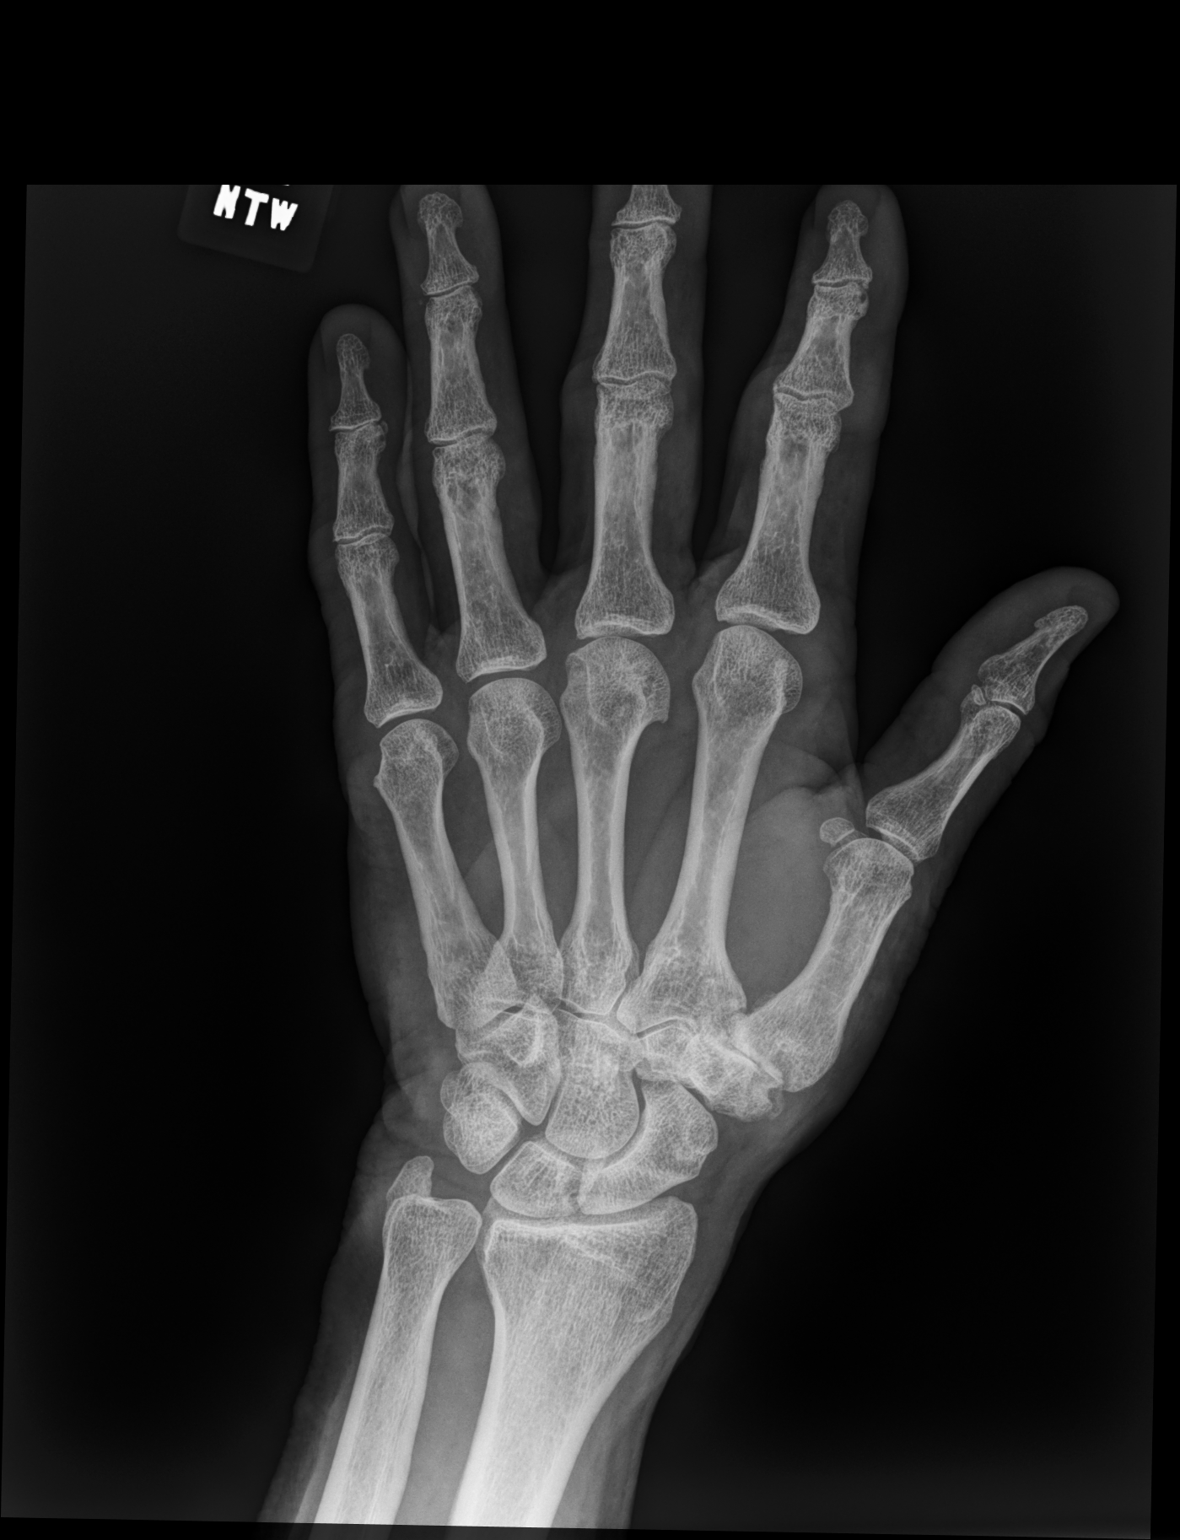

[hand oblique]
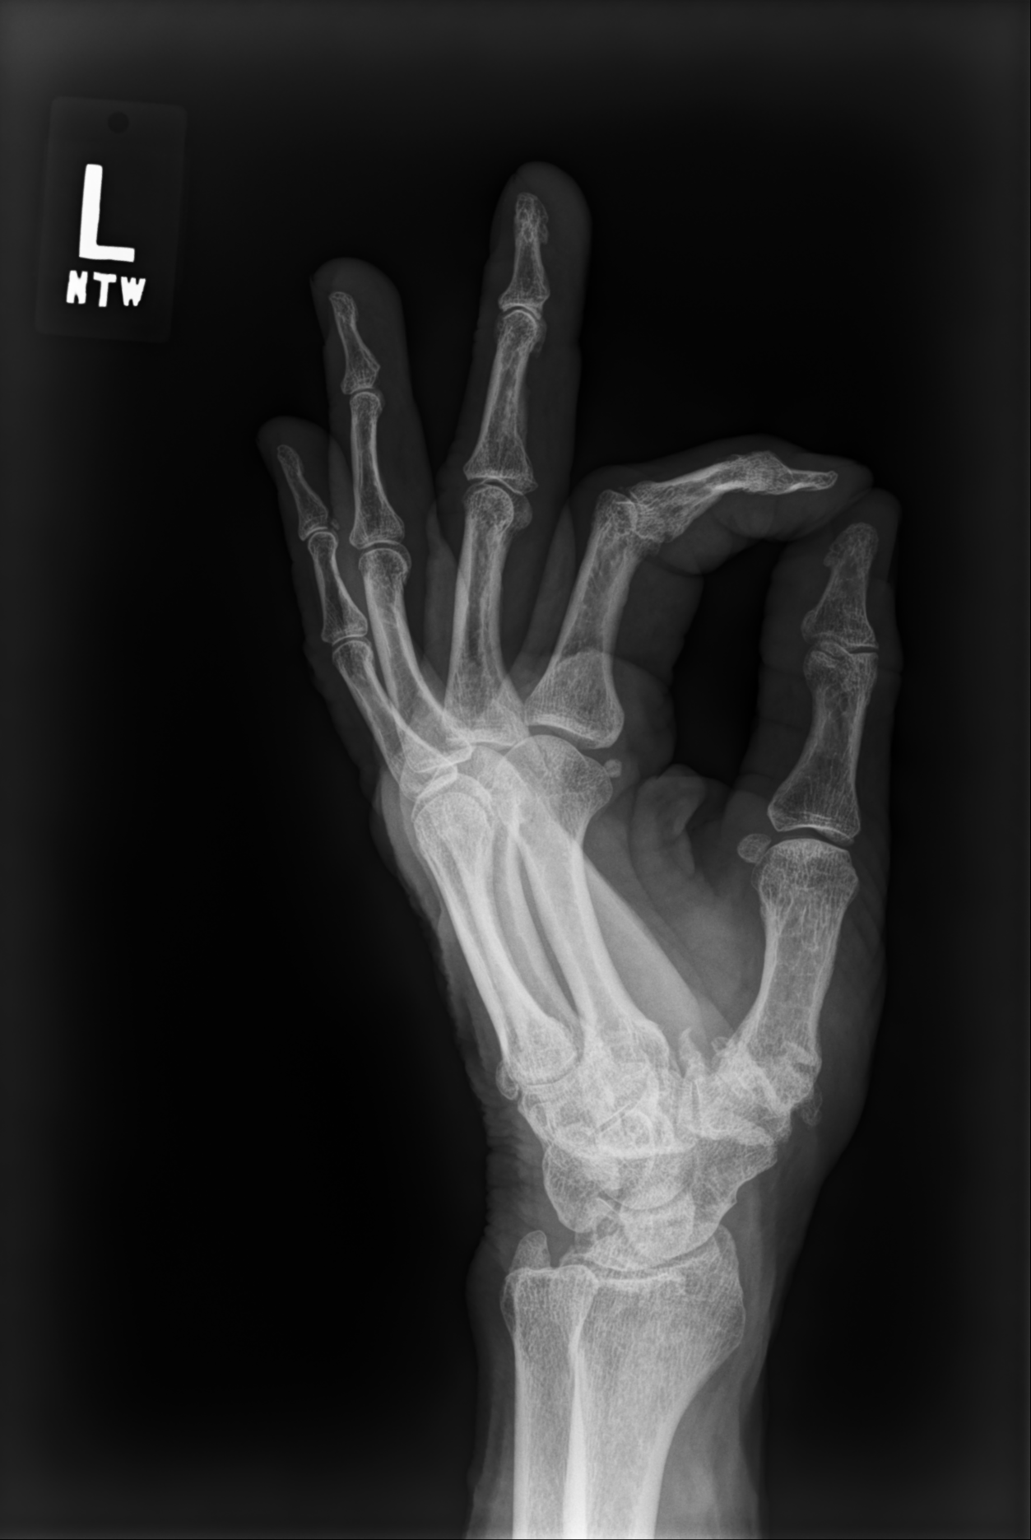

[hand lat]
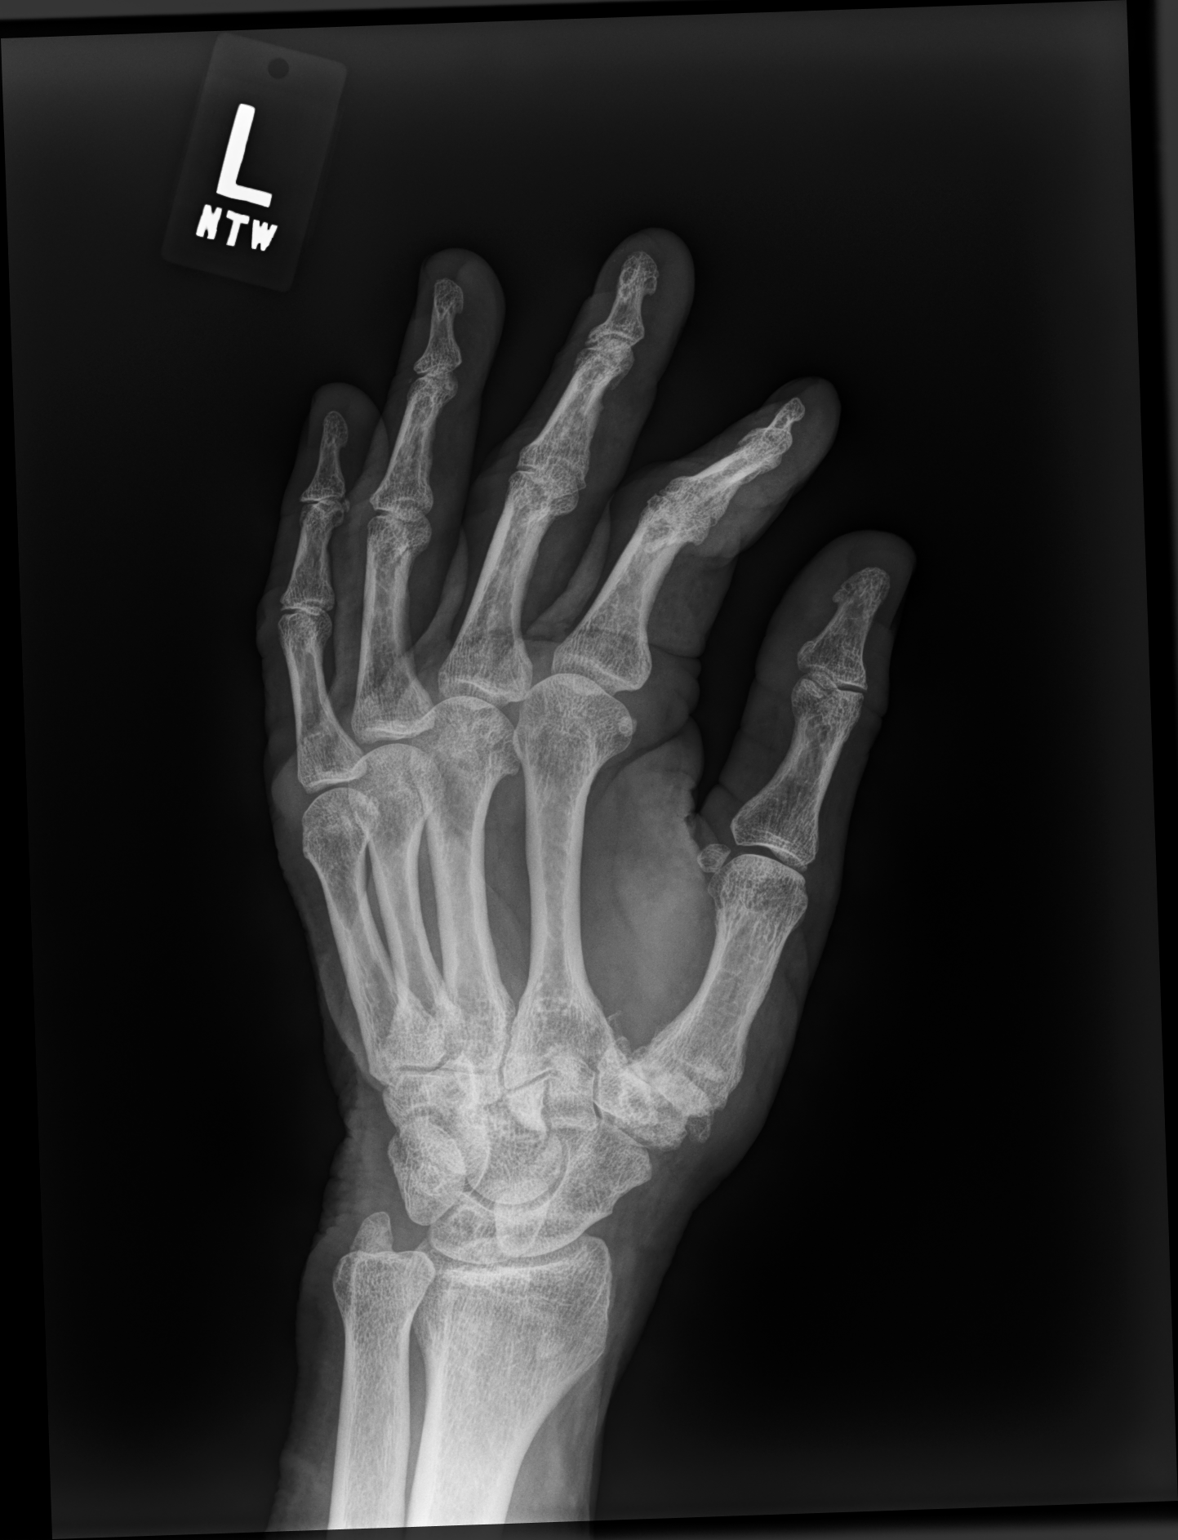

[3 of 3 positions shown; findings below may reference images not displayed]

FINDINGS: There is no evidence of fracture or dislocation. Severe narrowing of
the first carpometacarpal joint is noted with osteophyte formation.
Soft tissues are unremarkable.
IMPRESSION: Osteoarthritis of the first carpometacarpal joint. No acute
abnormality seen in the left hand.

## 2020-12-03 DIAGNOSIS — E782 Mixed hyperlipidemia: Secondary | ICD-10-CM | POA: Diagnosis not present

## 2020-12-03 DIAGNOSIS — R7303 Prediabetes: Secondary | ICD-10-CM | POA: Diagnosis not present

## 2020-12-03 DIAGNOSIS — K2100 Gastro-esophageal reflux disease with esophagitis, without bleeding: Secondary | ICD-10-CM | POA: Diagnosis not present

## 2020-12-03 DIAGNOSIS — B3782 Candidal enteritis: Secondary | ICD-10-CM | POA: Diagnosis not present

## 2020-12-03 DIAGNOSIS — E559 Vitamin D deficiency, unspecified: Secondary | ICD-10-CM | POA: Diagnosis not present

## 2020-12-03 DIAGNOSIS — J301 Allergic rhinitis due to pollen: Secondary | ICD-10-CM | POA: Diagnosis not present

## 2020-12-03 DIAGNOSIS — E7841 Elevated Lipoprotein(a): Secondary | ICD-10-CM | POA: Diagnosis not present

## 2020-12-06 DIAGNOSIS — G4733 Obstructive sleep apnea (adult) (pediatric): Secondary | ICD-10-CM | POA: Diagnosis not present

## 2020-12-10 DIAGNOSIS — G4733 Obstructive sleep apnea (adult) (pediatric): Secondary | ICD-10-CM | POA: Diagnosis not present

## 2020-12-22 DIAGNOSIS — B3782 Candidal enteritis: Secondary | ICD-10-CM | POA: Diagnosis not present

## 2020-12-22 DIAGNOSIS — B3789 Other sites of candidiasis: Secondary | ICD-10-CM | POA: Diagnosis not present

## 2020-12-22 DIAGNOSIS — J328 Other chronic sinusitis: Secondary | ICD-10-CM | POA: Diagnosis not present

## 2020-12-22 DIAGNOSIS — R799 Abnormal finding of blood chemistry, unspecified: Secondary | ICD-10-CM | POA: Diagnosis not present

## 2020-12-22 LAB — HEPATIC FUNCTION PANEL
ALT: 15 (ref 10–40)
AST: 18 (ref 14–40)
Bilirubin, Total: 0.4

## 2020-12-22 LAB — COMPREHENSIVE METABOLIC PANEL WITH GFR
Albumin: 4.6 (ref 3.5–5.0)
Calcium: 9.7 (ref 8.7–10.7)
GFR calc Af Amer: 72
GFR calc non Af Amer: 62
Globulin: 2.2

## 2020-12-22 LAB — BASIC METABOLIC PANEL WITH GFR
BUN: 16 (ref 4–21)
Chloride: 107 (ref 99–108)
Creatinine: 1.2 (ref 0.6–1.3)
Glucose: 120
Potassium: 4.4 (ref 3.4–5.3)
Sodium: 145 (ref 137–147)

## 2021-01-06 DIAGNOSIS — G4733 Obstructive sleep apnea (adult) (pediatric): Secondary | ICD-10-CM | POA: Diagnosis not present

## 2021-01-11 ENCOUNTER — Encounter: Payer: Self-pay | Admitting: Family Medicine

## 2021-01-13 DIAGNOSIS — M19042 Primary osteoarthritis, left hand: Secondary | ICD-10-CM | POA: Diagnosis not present

## 2021-01-13 DIAGNOSIS — M170 Bilateral primary osteoarthritis of knee: Secondary | ICD-10-CM | POA: Diagnosis not present

## 2021-01-13 DIAGNOSIS — M19041 Primary osteoarthritis, right hand: Secondary | ICD-10-CM | POA: Diagnosis not present

## 2021-01-13 DIAGNOSIS — M174 Other bilateral secondary osteoarthritis of knee: Secondary | ICD-10-CM | POA: Diagnosis not present

## 2021-01-13 DIAGNOSIS — M1711 Unilateral primary osteoarthritis, right knee: Secondary | ICD-10-CM | POA: Diagnosis not present

## 2021-01-15 MED ORDER — NEOMYCIN-POLYMYXIN-DEXAMETH 3.5-10000-0.1 OP OINT: 1.0000 "application " | TOPICAL_OINTMENT | Freq: Two times a day (BID) | OPHTHALMIC | 1 refills | Status: DC | PRN

## 2021-01-25 ENCOUNTER — Other Ambulatory Visit: Payer: Self-pay | Admitting: Urology

## 2021-01-28 DIAGNOSIS — H2513 Age-related nuclear cataract, bilateral: Secondary | ICD-10-CM | POA: Diagnosis not present

## 2021-01-28 DIAGNOSIS — H52203 Unspecified astigmatism, bilateral: Secondary | ICD-10-CM | POA: Diagnosis not present

## 2021-02-03 DIAGNOSIS — G4733 Obstructive sleep apnea (adult) (pediatric): Secondary | ICD-10-CM | POA: Diagnosis not present

## 2021-02-25 DIAGNOSIS — R6889 Other general symptoms and signs: Secondary | ICD-10-CM | POA: Diagnosis not present

## 2021-02-25 DIAGNOSIS — J310 Chronic rhinitis: Secondary | ICD-10-CM | POA: Diagnosis not present

## 2021-02-25 DIAGNOSIS — R42 Dizziness and giddiness: Secondary | ICD-10-CM | POA: Diagnosis not present

## 2021-03-04 DIAGNOSIS — M19041 Primary osteoarthritis, right hand: Secondary | ICD-10-CM | POA: Diagnosis not present

## 2021-03-04 DIAGNOSIS — M180 Bilateral primary osteoarthritis of first carpometacarpal joints: Secondary | ICD-10-CM | POA: Diagnosis not present

## 2021-03-04 DIAGNOSIS — M1812 Unilateral primary osteoarthritis of first carpometacarpal joint, left hand: Secondary | ICD-10-CM | POA: Diagnosis not present

## 2021-03-05 DIAGNOSIS — H903 Sensorineural hearing loss, bilateral: Secondary | ICD-10-CM | POA: Diagnosis not present

## 2021-03-06 DIAGNOSIS — G4733 Obstructive sleep apnea (adult) (pediatric): Secondary | ICD-10-CM | POA: Diagnosis not present

## 2021-03-10 DIAGNOSIS — G4733 Obstructive sleep apnea (adult) (pediatric): Secondary | ICD-10-CM | POA: Diagnosis not present

## 2021-03-18 ENCOUNTER — Telehealth: Payer: Self-pay | Admitting: Family Medicine

## 2021-03-18 NOTE — Chronic Care Management (AMB) (Signed)
  Chronic Care Management   Outreach Note  03/18/2021 Name: FOSTER FRERICKS MRN: [id number] DOB: [birth year]  Referred by: Leamon Arnt, MD Reason for referral : No chief complaint on file.   An unsuccessful telephone outreach was attempted today. The patient was referred to the pharmacist for assistance with care management and care coordination.   Follow Up Plan:   Lauretta Grill Upstream Scheduler

## 2021-03-29 ENCOUNTER — Other Ambulatory Visit: Payer: Self-pay | Admitting: Family Medicine

## 2021-03-31 ENCOUNTER — Telehealth: Payer: Self-pay | Admitting: Family Medicine

## 2021-03-31 NOTE — Chronic Care Management (AMB) (Signed)
  Chronic Care Management   Note  03/31/2021 Name: Eugene Mcdaniel MRN: [id number] DOB: Dec 23, 1950  Eugene Mcdaniel is a 70 y.o. year old male who is a primary care patient of Leamon Arnt, MD. I reached out to Elveria Royals by phone today in response to a referral sent by Mr. Eugene Mcdaniel's PCP, Leamon Arnt, MD.   Eugene Mcdaniel was given information about Chronic Care Management services today including:  1. CCM service includes personalized support from designated clinical staff supervised by his physician, including individualized plan of care and coordination with other care providers 2. 24/7 contact phone numbers for assistance for urgent and routine care needs. 3. Service will only be billed when office clinical staff spend 20 minutes or more in a month to coordinate care. 4. Only one practitioner may furnish and bill the service in a calendar month. 5. The patient may stop CCM services at any time (effective at the end of the month) by phone call to the office staff.   Patient did not agree to enrollment in care management services and does not wish to consider at this time.  Follow up plan:   Lauretta Grill Upstream Scheduler

## 2021-04-05 DIAGNOSIS — G4733 Obstructive sleep apnea (adult) (pediatric): Secondary | ICD-10-CM | POA: Diagnosis not present

## 2021-04-28 DIAGNOSIS — G4733 Obstructive sleep apnea (adult) (pediatric): Secondary | ICD-10-CM | POA: Diagnosis not present

## 2021-05-06 DIAGNOSIS — G4733 Obstructive sleep apnea (adult) (pediatric): Secondary | ICD-10-CM | POA: Diagnosis not present

## 2021-06-22 ENCOUNTER — Telehealth: Payer: Self-pay

## 2021-06-22 NOTE — Telephone Encounter (Signed)
LVM to give the office a call back.  Nurse Assessment Nurse: Lorenda Cahill, RN, Caryl Pina Date/Time Eilene Ghazi Time): 06/20/2021 11:37:10 AM Confirm and document reason for call. If symptomatic, describe symptoms. ---Caller has Covid and wants to know if he can get meds for it. Has a dry cough and congestions as well. Does the patient have any new or worsening symptoms? ---Yes Will a triage be completed? ---Yes Related visit to physician within the last 2 weeks? ---No Does the PT have any chronic conditions? (i.e. diabetes, asthma, this includes High risk factors for pregnancy, etc.) ---No Is this a behavioral health or substance abuse call? ---No Guidelines Guideline Title Affirmed Question Affirmed Notes Nurse Date/Time ([place] Time) COVID-19 - Diagnosed or Suspected [1] HIGH RISK for severe COVID complications (e.g., weak immune system, age > 33 years, obesity with BMI > 25, pregnant, chronic lung disease or other chronic medical Amado Nash 06/20/2021 11:38:36 AM PLEASE NOTE: All timestamps contained within this report are represented as [place] Standard Time. CONFIDENTIALTY NOTICE: This fax transmission is intended only for the addressee. It contains information that is legally privileged, confidential or otherwise protected from use or disclosure. If you are not the intended recipient, you are strictly prohibited from reviewing, disclosing, copying using or disseminating any of this information or taking any action in reliance on or regarding this information. If you have received this fax in error, please notify us immediately by telephone so that we can arrange for its return to [place]. Phone: [phone], Toll-Free: [phone], Fax: [phone] Page: 2 of 3 Call Id: [id number] Guidelines Guideline Title Affirmed Question Affirmed Notes Nurse Date/Time Eilene Ghazi Time) condition) AND [2] COVID symptoms (e.g., cough, fever) (Exceptions: Already seen by PCP and no new or  worsening symptoms.) Disp. Time Eilene Ghazi Time) Disposition Final User 06/20/2021 11:47:17 AM Called On-Call Provider Lorenda Cahill, RN, Caryl Pina 06/20/2021 11:43:32 AM Call PCP Now Yes Lorenda Cahill, RN, Hulan Saas Disagree/Comply Comply Caller Understands Yes PreDisposition Home Care Care Advice Given Per Guideline CALL PCP NOW: * You need to discuss this with your doctor (or NP/PA). * I'll page the on-call provider now. If you haven't heard from the provider (or me) within 30 minutes, call again. * Cough: Use cough drops. * Feeling dehydrated: Drink extra liquids. If the air in your home is dry, use a humidifier. * For fevers above 101 F (38.3 C) take either acetaminophen or ibuprofen. FEVER MEDICINES: CALL BACK IF: CARE ADVICE given per COVID-19 - DIAGNOSED OR SUSPECTED (Adult) guideline. Comments User: Bradd Burner, RN Date/Time Eilene Ghazi Time): 06/20/2021 11:51:25 AM Relayed information to caller from provider. Caller states he understands the information. All questions answered at this time. Paging DoctorName Phone DateTime Result/ Outcome Message Type Notes Grier Mitts- MD [phone] 06/20/2021 11:47:17 AM Called On Call Provider - Reached Doctor Paged Grier Mitts- MD 06/20/2021 11:48:33 AM Spoke with On Call - General Message Result Spoke with provider on call, she states that if patient becomes more symptomatic to go to an Urgent care or emergency room. Otherwise, call PCP office on Monday to discuss getting started on medication

## 2021-06-22 NOTE — Telephone Encounter (Signed)
Called patient and he said that he is feeling better and will call in the morning after a re-test.

## 2021-06-22 NOTE — Telephone Encounter (Signed)
Please try patient again to schedule a virtual visit.

## 2021-07-27 DIAGNOSIS — G4733 Obstructive sleep apnea (adult) (pediatric): Secondary | ICD-10-CM | POA: Diagnosis not present

## 2021-08-13 ENCOUNTER — Encounter: Payer: Medicare Other | Admitting: Family Medicine

## 2021-11-04 DIAGNOSIS — G4733 Obstructive sleep apnea (adult) (pediatric): Secondary | ICD-10-CM | POA: Diagnosis not present

## 2021-11-18 ENCOUNTER — Ambulatory Visit (INDEPENDENT_AMBULATORY_CARE_PROVIDER_SITE_OTHER): Payer: Medicare Other | Admitting: Family Medicine

## 2021-11-18 ENCOUNTER — Ambulatory Visit: Payer: Medicare Other

## 2021-11-18 ENCOUNTER — Other Ambulatory Visit: Payer: Self-pay

## 2021-11-18 ENCOUNTER — Encounter: Payer: Self-pay | Admitting: Family Medicine

## 2021-11-18 VITALS — BP 120/70 | HR 58 | Temp 97.4°F | Ht 70.0 in | Wt 180.4 lb

## 2021-11-18 DIAGNOSIS — G4733 Obstructive sleep apnea (adult) (pediatric): Secondary | ICD-10-CM

## 2021-11-18 DIAGNOSIS — E7849 Other hyperlipidemia: Secondary | ICD-10-CM | POA: Diagnosis not present

## 2021-11-18 DIAGNOSIS — J310 Chronic rhinitis: Secondary | ICD-10-CM

## 2021-11-18 DIAGNOSIS — N138 Other obstructive and reflux uropathy: Secondary | ICD-10-CM

## 2021-11-18 DIAGNOSIS — Z9989 Dependence on other enabling machines and devices: Secondary | ICD-10-CM | POA: Diagnosis not present

## 2021-11-18 DIAGNOSIS — K219 Gastro-esophageal reflux disease without esophagitis: Secondary | ICD-10-CM

## 2021-11-18 DIAGNOSIS — G729 Myopathy, unspecified: Secondary | ICD-10-CM

## 2021-11-18 DIAGNOSIS — Z0000 Encounter for general adult medical examination without abnormal findings: Secondary | ICD-10-CM | POA: Diagnosis not present

## 2021-11-18 DIAGNOSIS — N401 Enlarged prostate with lower urinary tract symptoms: Secondary | ICD-10-CM | POA: Diagnosis not present

## 2021-11-18 NOTE — Patient Instructions (Signed)
Please return in 12 months for your annual complete physical; please come fasting. Schedule a morning appt for lab and nurse to get labwork drawn and receive your pneumovax.   Send me the lipid panel results please.   If you have any questions or concerns, please don't hesitate to send me a message via MyChart or call the office at [phone]. Thank you for visiting with [place] today! It's our pleasure caring for you.

## 2021-11-18 NOTE — Progress Notes (Signed)
Subjective  Chief Complaint  Patient presents with   Annual Exam    Non fasting   Hyperlipidemia   Gastroesophageal Reflux    HPI: Eugene Mcdaniel is a 70 y.o. male who presents to [facility] today for a Male Wellness Visit. He also has the concerns and/or needs as listed above in the chief complaint. These will be addressed in addition to the Health Maintenance Visit.   Wellness Visit: annual visit with health maintenance review and exam   Health maintenance: Screens are up-to-date.  He is eligible for Pneumovax today.  Continues to live a healthy lifestyle.  Will be golfing later today.  Healthy diet.  Body mass index is 25.88 kg/m. Wt Readings from Last 3 Encounters:  11/18/21 180 lb 6.4 oz (81.8 kg)  08/12/20 165 lb 9.6 oz (75.1 kg)  06/18/19 174 lb (78.9 kg)     Chronic disease management visit and/or acute problem visit: Hyperlipidemia, likely familial: We addressed today.  He reports he did see the integrative health specialist who did a full lipo protein panel and assess that his cholesterol was not an issue.  He continues to refuse statins and is very hesitant to start medications.  He does not believe that hyperlipidemia is a significant cause of morbidity.  He does not have a premature family history of heart disease.  He has no other risk factors for cardiovascular disease. Dyspepsia and throat clearing with history of chronic GERD: Fortunately this is improved.  Reviewed ENT notes.  Rare PPI use now. BPH with obstruction: Has not seen urology this year.  Due for PSA.  On Flomax which is controlling symptoms. Chronic rhinitis managed by allergist.  Controlled Uses CPAP for sleep apnea.  No concerns  Patient Active Problem List   Diagnosis Date Noted   OSA on CPAP 01/17/2018   Refusal of statin medication by patient 12/27/2017   Dyspepsia 06/13/2017   Gastroesophageal reflux disease without esophagitis 03/28/2017   Familial hyperlipidemia  03/28/2017   Myalgia due to statin 08/12/2020   Hyperplastic colon polyp 12/28/2017   Constipation 06/15/2017   BPH with urinary obstruction 03/28/2017   Primary osteoarthritis involving multiple joints 73/22/0254   Umbilical hernia without obstruction and without gangrene 06/18/2019   Rhinitis, chronic 01/17/2018   Seasonal allergic rhinitis due to pollen 03/28/2017   Tendinitis of both rotator cuffs 10/21/2015   Globus pharyngeus 05/25/2019   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   Health Maintenance  Topic Date Due   Pneumonia Vaccine 8+ Years old (2 - PCV) 08/12/2021   TETANUS/TDAP  02/27/2026   COLONOSCOPY (Pts 45-44yrs Insurance coverage will need to be confirmed)  09/10/2027   Hepatitis C Screening  Completed   Zoster Vaccines- Shingrix  Completed   HPV VACCINES  Aged Out   INFLUENZA VACCINE  Discontinued   COVID-19 Vaccine  Discontinued   Immunization History  Administered Date(s) Administered   Moderna Sars-Covid-2 Vaccination 02/09/2020, 03/11/2020   Pneumococcal Polysaccharide-23 08/12/2020   Tdap 02/28/2016   Zoster Recombinat (Shingrix) 05/19/2018, 07/20/2018, 08/07/2018   We updated and reviewed the patient's past history in detail and it is documented below. Allergies: Patient is allergic to atorvastatin and simvastatin. Past Medical History  has a past medical history of Allergy, Colon polyp, GERD (gastroesophageal reflux disease), Hypercholesterolemia, Hyperplastic colon polyp (12/28/2017), Primary osteoarthritis involving multiple joints, Seasonal allergic rhinitis due to pollen, and Sleep apnea. Past Surgical History Patient  has a past surgical history that includes Appendectomy; colonoscopy  (  06/2005); Tonsillectomy and adenoidectomy (1962); and Medial collateral ligament repair, knee (Right). Social History Patient  reports that he quit smoking about 34 years ago. His smoking use included cigarettes. He has a 60.00 pack-year smoking history. He has quit  using smokeless tobacco. He reports current alcohol use. He reports that he does not use drugs. Family History family history includes Arthritis in his father; Healthy in his daughter and sister; Heart disease in his father; Lung cancer in his mother. Review of Systems: Constitutional: negative for fever or malaise Ophthalmic: negative for photophobia, double vision or loss of vision Cardiovascular: negative for chest pain, dyspnea on exertion, or new LE swelling Respiratory: negative for SOB or persistent cough Gastrointestinal: negative for abdominal pain, change in bowel habits or melena Genitourinary: negative for dysuria or gross hematuria Musculoskeletal: negative for new gait disturbance or muscular weakness Integumentary: negative for new or persistent rashes Neurological: negative for TIA or stroke symptoms Psychiatric: negative for SI or delusions Allergic/Immunologic: negative for hives  Patient Care Team    Relationship Specialty Notifications Start End  Leamon Arnt, MD PCP - General Family Medicine  12/27/17   Irine Seal, MD Attending Physician Urology  12/27/17   Breck Coons, MD Consulting Physician Gastroenterology  12/28/17   Marius Ditch, MD Attending Physician Pulmonary Disease  06/18/19   Jerrell Belfast, MD Consulting Physician Otolaryngology  2/99/24   Modena Nunnery, MD Referring Physician Family Medicine  08/12/20   Harriett Sine, MD Consulting Physician Dermatology  08/12/20    Objective  Vitals: BP 120/70    Pulse (!) 58    Temp (!) 97.4 F (36.3 C) (Temporal)    Ht 5\' 10"  (1.778 m)    Wt 180 lb 6.4 oz (81.8 kg)    SpO2 99%    BMI 25.88 kg/m  General:  Well developed, well nourished, no acute distress  Psych:  Alert and orientedx3,normal mood and affect HEENT:  Normocephalic, atraumatic, non-icteric sclera, PERRL, oropharynx is clear without mass or exudate, supple neck without adenopathy, mass or thyromegaly Cardiovascular:  Normal S1, S2, RRR  without gallop, rub or murmur, nondisplaced PMI, +2 distal pulses in bilateral upper and lower extremities. Respiratory:  Good breath sounds bilaterally, CTAB with normal respiratory effort Gastrointestinal: normal bowel sounds, soft, non-tender, no noted masses. No HSM MSK: no deformities, contusions. Joints are without erythema or swelling. Spine and CVA region are nontender Skin:  Warm, no rashes or suspicious lesions noted Neurologic:    Mental status is normal.  Gross motor and sensory exams are normal. Stable gait. No tremor    Assessment  1. Annual physical exam   2. BPH with urinary obstruction   3. Familial hyperlipidemia   4. Myopathy, unspecified   5. OSA on CPAP   6. Rhinitis, chronic   7. Gastroesophageal reflux disease without esophagitis      Plan  Male Wellness Visit: Age appropriate Health Maintenance and Prevention measures were discussed with patient. Included topics are cancer screening recommendations, ways to keep healthy (see AVS) including dietary and exercise recommendations, regular eye and dental care, use of seat belts, and avoidance of moderate alcohol use and tobacco use.  BMI: discussed patient's BMI and encouraged positive lifestyle modifications to help get to or maintain a target BMI. HM needs and immunizations were addressed and ordered. See below for orders. See HM and immunization section for updates.  He will return for Pneumovax Routine labs and screening tests ordered including cmp, cbc and lipids where appropriate.  Patient will return for fasting lab work. Discussed recommendations regarding Vit D and calcium supplementation (see AVS)  Chronic disease f/u and/or acute problem visit: (deemed necessary to be done in addition to the wellness visit): Hyperlipidemia: We will check fasting lab work.  He will send me his lipoprotein analysis.  And we can consider further discussion regarding prescription treatment is in order. Continue Flomax for BPH.   Check PSA for screening GERD is well controlled with rare PPI use.  Throat clearing is improved.  Has worked with physical therapist Continue allergy medicines Continue CPAP  Follow up: 12 months for complete physical [facility] side effects, risks, benefits, and alternatives for medications and treatment plan prescribed today were discussed, and the patient expressed understanding of the given instructions. Patient is instructed to call or message via MyChart if he/she has any questions or concerns regarding our treatment plan. No barriers to understanding were identified. We discussed Red Flag symptoms and signs in detail. Patient expressed understanding regarding what to do in case of urgent or emergency type symptoms.  Medication list was reconciled, printed and provided to the patient in AVS. Patient instructions and summary information was reviewed with the patient as documented in the AVS. This note was prepared with assistance of Dragon voice recognition software. Occasional wrong-word or sound-a-like substitutions may have occurred due to the inherent limitations of voice recognition software  This visit occurred during the SARS-CoV-2 public health emergency.  Safety protocols were in place, including screening questions prior to the visit, additional usage of staff PPE, and extensive cleaning of exam room while observing appropriate contact time as indicated for disinfecting solutions.   Orders Placed This Encounter  Procedures   CBC with Differential/Platelet   Comprehensive metabolic panel   Lipid panel   TSH   PSA, Medicare (  [facility] only)   No orders of the defined types were placed in this encounter.

## 2021-11-19 ENCOUNTER — Encounter: Payer: Self-pay | Admitting: Family Medicine

## 2021-11-25 ENCOUNTER — Ambulatory Visit (INDEPENDENT_AMBULATORY_CARE_PROVIDER_SITE_OTHER): Payer: Medicare Other

## 2021-11-25 ENCOUNTER — Other Ambulatory Visit: Payer: Self-pay

## 2021-11-25 ENCOUNTER — Other Ambulatory Visit (INDEPENDENT_AMBULATORY_CARE_PROVIDER_SITE_OTHER): Payer: Medicare Other

## 2021-11-25 DIAGNOSIS — N138 Other obstructive and reflux uropathy: Secondary | ICD-10-CM | POA: Diagnosis not present

## 2021-11-25 DIAGNOSIS — N401 Enlarged prostate with lower urinary tract symptoms: Secondary | ICD-10-CM

## 2021-11-25 DIAGNOSIS — Z23 Encounter for immunization: Secondary | ICD-10-CM | POA: Diagnosis not present

## 2021-11-25 DIAGNOSIS — E7849 Other hyperlipidemia: Secondary | ICD-10-CM

## 2021-11-25 DIAGNOSIS — Z0000 Encounter for general adult medical examination without abnormal findings: Secondary | ICD-10-CM

## 2021-11-25 LAB — CBC WITH DIFFERENTIAL/PLATELET
Basophils Absolute: 0 10*3/uL (ref 0.0–0.1)
Basophils Relative: 0.8 % (ref 0.0–3.0)
Eosinophils Absolute: 0.1 10*3/uL (ref 0.0–0.7)
Eosinophils Relative: 2 % (ref 0.0–5.0)
HCT: 43 % (ref 39.0–52.0)
Hemoglobin: 14.5 g/dL (ref 13.0–17.0)
Lymphocytes Relative: 29.2 % (ref 12.0–46.0)
Lymphs Abs: 1.3 10*3/uL (ref 0.7–4.0)
MCHC: 33.8 g/dL (ref 30.0–36.0)
MCV: 96.1 fl (ref 78.0–100.0)
Monocytes Absolute: 0.4 10*3/uL (ref 0.1–1.0)
Monocytes Relative: 8.5 % (ref 3.0–12.0)
Neutro Abs: 2.6 10*3/uL (ref 1.4–7.7)
Neutrophils Relative %: 59.5 % (ref 43.0–77.0)
Platelets: 250 10*3/uL (ref 150.0–400.0)
RBC: 4.48 Mil/uL (ref 4.22–5.81)
RDW: 13 % (ref 11.5–15.5)
WBC: 4.4 10*3/uL (ref 4.0–10.5)

## 2021-11-25 LAB — LIPID PANEL
Cholesterol: 232 mg/dL — ABNORMAL HIGH (ref 0–200)
HDL: 43.2 mg/dL (ref >39.00)
LDL Cholesterol: 169 mg/dL — ABNORMAL HIGH (ref 0–99)
NonHDL: 189.02
Total CHOL/HDL Ratio: 5
Triglycerides: 98 mg/dL (ref 0.0–149.0)
VLDL: 19.6 mg/dL (ref 0.0–40.0)

## 2021-11-25 LAB — COMPREHENSIVE METABOLIC PANEL
ALT: 13 U/L (ref 0–53)
AST: 13 U/L (ref 0–37)
Albumin: 4.3 g/dL (ref 3.5–5.2)
Alkaline Phosphatase: 50 U/L (ref 39–117)
BUN: 22 mg/dL (ref 6–23)
CO2: 30 mEq/L (ref 19–32)
Calcium: 9.5 mg/dL (ref 8.4–10.5)
Chloride: 105 mEq/L (ref 96–112)
Creatinine, Ser: 1.02 mg/dL (ref 0.40–1.50)
GFR: 73.93 mL/min (ref >60.00)
Glucose, Bld: 102 mg/dL — ABNORMAL HIGH (ref 70–99)
Potassium: 4.8 mEq/L (ref 3.5–5.1)
Sodium: 142 mEq/L (ref 135–145)
Total Bilirubin: 0.5 mg/dL (ref 0.2–1.2)
Total Protein: 6.6 g/dL (ref 6.0–8.3)

## 2021-11-25 LAB — TSH: TSH: 1.82 u[IU]/mL (ref 0.35–5.50)

## 2021-11-25 LAB — PSA, MEDICARE: PSA: 3.85 ng/ml (ref 0.10–4.00)

## 2021-12-01 ENCOUNTER — Encounter: Payer: Self-pay | Admitting: Family Medicine

## 2021-12-02 ENCOUNTER — Other Ambulatory Visit: Payer: Self-pay

## 2021-12-02 DIAGNOSIS — M25519 Pain in unspecified shoulder: Secondary | ICD-10-CM

## 2021-12-03 DIAGNOSIS — M79602 Pain in left arm: Secondary | ICD-10-CM | POA: Diagnosis not present

## 2021-12-03 DIAGNOSIS — M542 Cervicalgia: Secondary | ICD-10-CM | POA: Diagnosis not present

## 2021-12-03 DIAGNOSIS — M545 Low back pain, unspecified: Secondary | ICD-10-CM | POA: Diagnosis not present

## 2021-12-03 DIAGNOSIS — M79601 Pain in right arm: Secondary | ICD-10-CM | POA: Diagnosis not present

## 2021-12-04 ENCOUNTER — Encounter: Payer: Self-pay | Admitting: Family Medicine

## 2021-12-07 DIAGNOSIS — M545 Low back pain, unspecified: Secondary | ICD-10-CM | POA: Diagnosis not present

## 2021-12-07 DIAGNOSIS — M542 Cervicalgia: Secondary | ICD-10-CM | POA: Diagnosis not present

## 2021-12-07 DIAGNOSIS — M79601 Pain in right arm: Secondary | ICD-10-CM | POA: Diagnosis not present

## 2021-12-07 DIAGNOSIS — M79602 Pain in left arm: Secondary | ICD-10-CM | POA: Diagnosis not present

## 2021-12-11 DIAGNOSIS — M79601 Pain in right arm: Secondary | ICD-10-CM | POA: Diagnosis not present

## 2021-12-11 DIAGNOSIS — M79602 Pain in left arm: Secondary | ICD-10-CM | POA: Diagnosis not present

## 2021-12-11 DIAGNOSIS — M542 Cervicalgia: Secondary | ICD-10-CM | POA: Diagnosis not present

## 2021-12-11 DIAGNOSIS — M545 Low back pain, unspecified: Secondary | ICD-10-CM | POA: Diagnosis not present

## 2021-12-15 DIAGNOSIS — M79602 Pain in left arm: Secondary | ICD-10-CM | POA: Diagnosis not present

## 2021-12-15 DIAGNOSIS — M79601 Pain in right arm: Secondary | ICD-10-CM | POA: Diagnosis not present

## 2021-12-15 DIAGNOSIS — M542 Cervicalgia: Secondary | ICD-10-CM | POA: Diagnosis not present

## 2021-12-15 DIAGNOSIS — M545 Low back pain, unspecified: Secondary | ICD-10-CM | POA: Diagnosis not present

## 2021-12-21 DIAGNOSIS — R200 Anesthesia of skin: Secondary | ICD-10-CM | POA: Diagnosis not present

## 2021-12-21 DIAGNOSIS — M5136 Other intervertebral disc degeneration, lumbar region: Secondary | ICD-10-CM | POA: Diagnosis not present

## 2021-12-21 DIAGNOSIS — M5030 Other cervical disc degeneration, unspecified cervical region: Secondary | ICD-10-CM | POA: Diagnosis not present

## 2021-12-21 DIAGNOSIS — M549 Dorsalgia, unspecified: Secondary | ICD-10-CM | POA: Diagnosis not present

## 2021-12-21 DIAGNOSIS — M5441 Lumbago with sciatica, right side: Secondary | ICD-10-CM | POA: Diagnosis not present

## 2021-12-21 DIAGNOSIS — R278 Other lack of coordination: Secondary | ICD-10-CM | POA: Diagnosis not present

## 2021-12-21 DIAGNOSIS — R202 Paresthesia of skin: Secondary | ICD-10-CM | POA: Diagnosis not present

## 2021-12-21 DIAGNOSIS — G8929 Other chronic pain: Secondary | ICD-10-CM | POA: Diagnosis not present

## 2021-12-30 ENCOUNTER — Other Ambulatory Visit: Payer: Self-pay | Admitting: Urology

## 2022-01-04 DIAGNOSIS — R202 Paresthesia of skin: Secondary | ICD-10-CM | POA: Diagnosis not present

## 2022-01-04 DIAGNOSIS — G8929 Other chronic pain: Secondary | ICD-10-CM | POA: Diagnosis not present

## 2022-01-04 DIAGNOSIS — R200 Anesthesia of skin: Secondary | ICD-10-CM | POA: Diagnosis not present

## 2022-01-04 DIAGNOSIS — M48061 Spinal stenosis, lumbar region without neurogenic claudication: Secondary | ICD-10-CM | POA: Diagnosis not present

## 2022-01-04 DIAGNOSIS — M5136 Other intervertebral disc degeneration, lumbar region: Secondary | ICD-10-CM | POA: Diagnosis not present

## 2022-01-04 DIAGNOSIS — M47812 Spondylosis without myelopathy or radiculopathy, cervical region: Secondary | ICD-10-CM | POA: Diagnosis not present

## 2022-01-04 DIAGNOSIS — M4802 Spinal stenosis, cervical region: Secondary | ICD-10-CM | POA: Diagnosis not present

## 2022-01-04 DIAGNOSIS — M5030 Other cervical disc degeneration, unspecified cervical region: Secondary | ICD-10-CM | POA: Diagnosis not present

## 2022-01-04 DIAGNOSIS — R278 Other lack of coordination: Secondary | ICD-10-CM | POA: Diagnosis not present

## 2022-01-04 DIAGNOSIS — M5441 Lumbago with sciatica, right side: Secondary | ICD-10-CM | POA: Diagnosis not present

## 2022-01-04 DIAGNOSIS — M5116 Intervertebral disc disorders with radiculopathy, lumbar region: Secondary | ICD-10-CM | POA: Diagnosis not present

## 2022-01-11 DIAGNOSIS — M4722 Other spondylosis with radiculopathy, cervical region: Secondary | ICD-10-CM | POA: Diagnosis not present

## 2022-01-11 DIAGNOSIS — M5416 Radiculopathy, lumbar region: Secondary | ICD-10-CM | POA: Diagnosis not present

## 2022-01-11 DIAGNOSIS — M5412 Radiculopathy, cervical region: Secondary | ICD-10-CM | POA: Diagnosis not present

## 2022-01-12 ENCOUNTER — Encounter: Payer: Self-pay | Admitting: Family Medicine

## 2022-01-21 ENCOUNTER — Encounter: Payer: Self-pay | Admitting: Family Medicine

## 2022-01-25 ENCOUNTER — Other Ambulatory Visit: Payer: Self-pay

## 2022-01-25 MED ORDER — ROSUVASTATIN CALCIUM 5 MG PO TABS: 5.0000 mg | ORAL_TABLET | ORAL | 3 refills | Status: DC

## 2022-02-03 DIAGNOSIS — H2513 Age-related nuclear cataract, bilateral: Secondary | ICD-10-CM | POA: Diagnosis not present

## 2022-02-03 DIAGNOSIS — H52203 Unspecified astigmatism, bilateral: Secondary | ICD-10-CM | POA: Diagnosis not present

## 2022-02-09 DIAGNOSIS — Z23 Encounter for immunization: Secondary | ICD-10-CM | POA: Diagnosis not present

## 2022-02-09 DIAGNOSIS — L814 Other melanin hyperpigmentation: Secondary | ICD-10-CM | POA: Diagnosis not present

## 2022-02-09 DIAGNOSIS — D1801 Hemangioma of skin and subcutaneous tissue: Secondary | ICD-10-CM | POA: Diagnosis not present

## 2022-02-09 DIAGNOSIS — L570 Actinic keratosis: Secondary | ICD-10-CM | POA: Diagnosis not present

## 2022-02-09 DIAGNOSIS — L821 Other seborrheic keratosis: Secondary | ICD-10-CM | POA: Diagnosis not present

## 2022-02-09 DIAGNOSIS — L578 Other skin changes due to chronic exposure to nonionizing radiation: Secondary | ICD-10-CM | POA: Diagnosis not present

## 2022-02-09 DIAGNOSIS — D229 Melanocytic nevi, unspecified: Secondary | ICD-10-CM | POA: Diagnosis not present

## 2022-02-12 DIAGNOSIS — R3912 Poor urinary stream: Secondary | ICD-10-CM | POA: Diagnosis not present

## 2022-02-12 DIAGNOSIS — R3911 Hesitancy of micturition: Secondary | ICD-10-CM | POA: Diagnosis not present

## 2022-02-18 DIAGNOSIS — M5416 Radiculopathy, lumbar region: Secondary | ICD-10-CM | POA: Diagnosis not present

## 2022-02-18 DIAGNOSIS — M5412 Radiculopathy, cervical region: Secondary | ICD-10-CM | POA: Diagnosis not present

## 2022-03-29 ENCOUNTER — Ambulatory Visit (INDEPENDENT_AMBULATORY_CARE_PROVIDER_SITE_OTHER): Payer: Medicare Other

## 2022-03-29 ENCOUNTER — Ambulatory Visit: Payer: Medicare Other | Admitting: Family Medicine

## 2022-03-29 VITALS — BP 116/78 | HR 55 | Ht 70.0 in | Wt 186.0 lb

## 2022-03-29 DIAGNOSIS — M79672 Pain in left foot: Secondary | ICD-10-CM | POA: Diagnosis not present

## 2022-03-29 MED ORDER — PREDNISONE 50 MG PO TABS: 50.0000 mg | ORAL_TABLET | Freq: Every day | ORAL | 0 refills | Status: DC

## 2022-03-29 MED ORDER — HYDROCODONE-ACETAMINOPHEN 5-325 MG PO TABS: 1.0000 | ORAL_TABLET | Freq: Four times a day (QID) | ORAL | 0 refills | Status: DC | PRN

## 2022-03-29 MED ORDER — CELECOXIB 200 MG PO CAPS: ORAL_CAPSULE | ORAL | 2 refills | Status: DC

## 2022-03-29 NOTE — Patient Instructions (Addendum)
Thank you for coming in today.  ? ?Please complete the exercises that the athletic trainer went over with you:  View at [email or web address]-[email or web address] using code: [email or web address] ? ?Please get an Xray today before you leave  ? ?Please go to [facility] to get the Short CAM Clyde Canterbury we talked about today. You may also be able to get it from [employer].   ? ?I've sent the prescriptions to your pharmacy.   ? ?Have fun on your trip! ? ?Recheck back in 1 month ?

## 2022-03-29 NOTE — Progress Notes (Signed)
? ?  I, Eugene Mcdaniel, LAT, ATC acting as a scribe for Eugene Leader, MD. ? ?Subjective:   ? ?CC: L foot pain ? ?HPI: Pt is a 71 y/o male c/o L foot pain ongoing since 4/28 and heel pain ongoing for a few months. Pt pushed off on the L 1st head in trying to stop his cat from getting a bird. Pt locates pain to distal medial arch and through transverse arch. Pt is an avid golfer. Pt leaves for [place] on Wednesday (2 days), for a week, and will be doing a lot of walking. ? ?L foot swelling: yes ?Aggravates: standing w/ weight on heel ?Treatments tried: avoiding walking barefoot, elevation, ice, massage roller, towel stretches ? ?Pertinent review of Systems: No fevers or chills ? ?Relevant historical information: BPH ? ? ?Objective:   ? ?Vitals:  ? 03/29/22 1347  ?BP: 116/78  ?Pulse: (!) 55  ?SpO2: 95%  ? ?General: Well Developed, well nourished, and in no acute distress.  ? ?MSK: Left foot: Mild swelling plantar calcaneus.  Tender palpation plantar calcaneus.  Normal foot and ankle motion.  Mild antalgic gait present. ? ?Lab and Radiology Results ? ?X-ray images left foot obtained today personally and independently interpreted ?No acute fractures. ?Await formal radiology review ? ? ? ?Impression and Recommendations:   ? ?Assessment and Plan: ?71 y.o. male with left plantar foot pain.  Suspicious for partial plantar fascial tear versus exacerbation of planter fasciitis.  Plan for treatment with short-term and medium to long-term goals. ? ?Short-term goal is designed to get him through this trip to [place] this week. ?Short-term goal focused on pain control and comfort.  CAM Walker boot as needed.  Celebrex as needed.  Emergency backup plan for prednisone and hydrocodone if needed.  Also recommend good arch supporting shoes with heel cushion which he already has.  Ice massage and stretching also.  Also recommend night splint. ? ?Medium to long-term goal eccentric exercises. ? ?Recheck in 1 month. ? ?PDMP reviewed  during this encounter. ?Orders Placed This Encounter  ?Procedures  ? DG Foot Complete Left  ?  Standing Status:   Future  ?  Number of Occurrences:   1  ?  Standing Expiration Date:   03/30/2023  ?  Order Specific Question:   Reason for Exam (SYMPTOM  OR DIAGNOSIS REQUIRED)  ?  Answer:   left foot pain  ?  Order Specific Question:   Preferred imaging location?  ?  Answer:   [facility]  ? ?Meds ordered this encounter  ?Medications  ? celecoxib (CELEBREX) 200 MG capsule  ?  Sig: One to 2 tablets by mouth daily as needed for pain.  ?  Dispense:  60 capsule  ?  Refill:  2  ? predniSONE (DELTASONE) 50 MG tablet  ?  Sig: Take 1 tablet (50 mg total) by mouth daily.  ?  Dispense:  5 tablet  ?  Refill:  0  ? HYDROcodone-acetaminophen (NORCO/VICODIN) 5-325 MG tablet  ?  Sig: Take 1 tablet by mouth every 6 (six) hours as needed.  ?  Dispense:  15 tablet  ?  Refill:  0  ? ? ?Discussed warning signs or symptoms. Please see discharge instructions. Patient expresses understanding. ? ? ?The above documentation has been reviewed and is accurate and complete Eugene Mcdaniel, M.D. ? ?

## 2022-03-30 ENCOUNTER — Encounter: Payer: Self-pay | Admitting: Family Medicine

## 2022-03-31 NOTE — Progress Notes (Signed)
Left foot x-ray looks normal to radiology.

## 2022-06-03 DIAGNOSIS — M5416 Radiculopathy, lumbar region: Secondary | ICD-10-CM | POA: Diagnosis not present

## 2022-06-07 DIAGNOSIS — L720 Epidermal cyst: Secondary | ICD-10-CM | POA: Diagnosis not present

## 2022-06-10 DIAGNOSIS — L720 Epidermal cyst: Secondary | ICD-10-CM | POA: Diagnosis not present

## 2022-06-22 ENCOUNTER — Ambulatory Visit: Payer: Medicare Other | Admitting: Family Medicine

## 2022-06-22 ENCOUNTER — Ambulatory Visit: Payer: Self-pay

## 2022-06-22 ENCOUNTER — Other Ambulatory Visit: Payer: Self-pay | Admitting: Family Medicine

## 2022-06-22 VITALS — BP 138/84 | HR 65 | Ht 70.0 in | Wt 183.6 lb

## 2022-06-22 DIAGNOSIS — M25521 Pain in right elbow: Secondary | ICD-10-CM | POA: Diagnosis not present

## 2022-06-22 DIAGNOSIS — L720 Epidermal cyst: Secondary | ICD-10-CM | POA: Diagnosis not present

## 2022-06-22 DIAGNOSIS — M7701 Medial epicondylitis, right elbow: Secondary | ICD-10-CM

## 2022-06-22 NOTE — Progress Notes (Signed)
   I, Wendy Poet, LAT, ATC, am serving as scribe for Dr. [name].  Eugene Mcdaniel is a 71 y.o. male who presents to [place] at [facility] today for R elbow pain.  He was last seen by Dr. Georgina Snell on 03/29/22 for L foot pain.  Today, pt reports R elbow pain that's chronic in nature and worsened on Sunday. MOI: Pt was playing golf and hit a driver shot and felt a "gun shot" like pain in his R elbow. He locates his pain to the medial aspect of the R elbow.  Radiating pain: no- did at time of time of injury R elbow swelling: yes Aggravating factors: any valgus force Treatments tried: none   Pertinent review of systems: No fevers or chills  Relevant historical information: BPH.   Exam:  BP 138/84   Pulse 65   Ht '5\' 10"'$  (1.778 m)   Wt 183 lb 9.6 oz (83.3 kg)   SpO2 96%   BMI 26.34 kg/m  General: Well Developed, well nourished, and in no acute distress.   MSK: Right elbow: Normal appearing Tender palpation at medial epicondyle. Normal elbow motion. Stable ligamentous exam without UCL laxity. Intact strength. Pain with resisted wrist flexion and supination.    Lab and Radiology Results  Diagnostic Limited MSK Ultrasound of: Right elbow Medial condyle visualized.  Small avulsion fleck present at medial epicondyle.  No visible tear at the common flexor tendon. Increased vascular changes on Doppler at medial epicondyle. UCL appears to be intact Impression: Medial epicondylitis without visible tear.      Assessment and Plan: 71 y.o. male with right medial epicondylitis.  Plan for eccentric exercises and check back in 6 weeks.  Consider nitroglycerin patch protocol if not improving.   PDMP not reviewed this encounter. Orders Placed This Encounter  Procedures   [place] LIMITED JOINT SPACE STRUCTURES UP RIGHT(NO LINKED CHARGES)    Order Specific Question:   Reason for Exam (SYMPTOM  OR DIAGNOSIS REQUIRED)    Answer:   R elbow pain    Order Specific  Question:   Preferred imaging location?    Answer:   [place]   No orders of the defined types were placed in this encounter.    Discussed warning signs or symptoms. Please see discharge instructions. Patient expresses understanding.   The above documentation has been reviewed and is accurate and complete [name], M.D.

## 2022-06-22 NOTE — Patient Instructions (Addendum)
Thank you for coming in today.   Please complete the exercises that the athletic trainer went over with you: View at [email or web address]-exercise-[email or web address] using code: [email or web address]  Theraband Flexbar   Check back in 6 weeks

## 2022-07-15 DIAGNOSIS — L720 Epidermal cyst: Secondary | ICD-10-CM | POA: Diagnosis not present

## 2022-07-15 DIAGNOSIS — L728 Other follicular cysts of the skin and subcutaneous tissue: Secondary | ICD-10-CM | POA: Diagnosis not present

## 2022-07-30 NOTE — Progress Notes (Deleted)
   I, Peterson Lombard, LAT, ATC acting as a scribe for Eugene Leader, MD.  Eugene Mcdaniel is a 71 y.o. male who presents to [place] at [facility] today for f/u R elbow pain thought to be due to medial epicondylitis.  MOI: Pt was playing golf and hit a driver shot and felt a "gun shot" like pain in his R elbow. Pt was last seen by Dr. Georgina Snell on 06/22/22 and was taught HEP. Today, pt reports   Pertinent review of systems: ***  Relevant historical information: ***   Exam:  There were no vitals taken for this visit. General: Well Developed, well nourished, and in no acute distress.   MSK: ***    Lab and Radiology Results No results found for this or any previous visit (from the past 72 hour(s)). No results found.     Assessment and Plan: 71 y.o. male with ***   PDMP not reviewed this encounter. No orders of the defined types were placed in this encounter.  No orders of the defined types were placed in this encounter.    Discussed warning signs or symptoms. Please see discharge instructions. Patient expresses understanding.   ***

## 2022-08-03 ENCOUNTER — Ambulatory Visit: Payer: Medicare Other | Admitting: Family Medicine

## 2022-08-30 DIAGNOSIS — L720 Epidermal cyst: Secondary | ICD-10-CM | POA: Diagnosis not present

## 2022-08-30 DIAGNOSIS — L24A9 Irritant contact dermatitis due friction or contact with other specified body fluids: Secondary | ICD-10-CM | POA: Diagnosis not present

## 2022-09-05 ENCOUNTER — Telehealth: Payer: Medicare Other | Admitting: Physician Assistant

## 2022-09-05 DIAGNOSIS — H00015 Hordeolum externum left lower eyelid: Secondary | ICD-10-CM

## 2022-09-05 MED ORDER — NEOMYCIN-POLYMYXIN-HC 3.5-10000-1 OP SUSP: 3.0000 [drp] | Freq: Four times a day (QID) | OPHTHALMIC | 0 refills | Status: AC

## 2022-09-05 NOTE — Progress Notes (Signed)
I have spent 5 minutes in review of e-visit questionnaire, review and updating patient chart, medical decision making and response to patient.   Farhad Burleson Cody Kaida Games, PA-C    

## 2022-09-05 NOTE — Progress Notes (Signed)
  E-Visit for Stye   We are sorry that you are not feeling well. Here is how we plan to help!  Based on what you have shared with me it looks like you have a stye.  A stye is an inflammation of the eyelid.  It is often a red, painful lump near the edge of the eyelid that may look like a boil or a pimple.  A stye develops when an infection occurs at the base of an eyelash.   We have made appropriate suggestions for you based upon your presentation: Simple styes can be treated without medical intervention.  Most styes either resolve spontaneously or resolve with simple home treatment by applying warm compresses or heated washcloth to the stye for about 10-15 minutes three to four times a day. This causes the stye to drain and resolve. Giving pain and tenderness raising concern for mild infection, I am sending in a topical antibiotic drop to apply as directed.   HOME CARE:  Wash your hands often! Let the stye open on its own. Don't squeeze or open it. Don't rub your eyes. This can irritate your eyes and let in bacteria.  If you need to touch your eyes, wash your hands first. Don't wear eye makeup or contact lenses until the area has healed.  GET HELP RIGHT AWAY IF:  Your symptoms do not improve. You develop blurred or loss of vision. Your symptoms worsen (increased discharge, pain or redness).   Thank you for choosing an e-visit.  Your e-visit answers were reviewed by a board certified advanced clinical practitioner to complete your personal care plan. Depending upon the condition, your plan could have included both over the counter or prescription medications.  Please review your pharmacy choice. Make sure the pharmacy is open so you can pick up prescription now. If there is a problem, you may contact your provider through [employer] and have the prescription routed to another pharmacy.  Your safety is important to [place]. If you have drug allergies check your prescription carefully.    For the next 24 hours you can use MyChart to ask questions about today's visit, request a non-urgent call back, or ask for a work or school excuse. You will get an email in the next two days asking about your experience. I hope that your e-visit has been valuable and will speed your recovery.

## 2022-09-26 ENCOUNTER — Other Ambulatory Visit: Payer: Self-pay | Admitting: Family Medicine

## 2022-09-27 DIAGNOSIS — M4802 Spinal stenosis, cervical region: Secondary | ICD-10-CM | POA: Diagnosis not present

## 2022-09-27 DIAGNOSIS — G5622 Lesion of ulnar nerve, left upper limb: Secondary | ICD-10-CM | POA: Diagnosis not present

## 2022-10-22 ENCOUNTER — Other Ambulatory Visit: Payer: Self-pay | Admitting: Urology

## 2022-11-10 DIAGNOSIS — G5622 Lesion of ulnar nerve, left upper limb: Secondary | ICD-10-CM | POA: Diagnosis not present

## 2022-11-10 DIAGNOSIS — M5412 Radiculopathy, cervical region: Secondary | ICD-10-CM | POA: Diagnosis not present

## 2022-11-10 DIAGNOSIS — M5030 Other cervical disc degeneration, unspecified cervical region: Secondary | ICD-10-CM | POA: Diagnosis not present

## 2022-11-10 DIAGNOSIS — M4802 Spinal stenosis, cervical region: Secondary | ICD-10-CM | POA: Diagnosis not present

## 2022-12-26 ENCOUNTER — Encounter: Payer: Self-pay | Admitting: Family Medicine

## 2023-01-04 DIAGNOSIS — G4733 Obstructive sleep apnea (adult) (pediatric): Secondary | ICD-10-CM | POA: Diagnosis not present

## 2023-01-05 ENCOUNTER — Other Ambulatory Visit: Payer: Self-pay | Admitting: Urology

## 2023-01-17 ENCOUNTER — Other Ambulatory Visit: Payer: Self-pay

## 2023-01-17 ENCOUNTER — Telehealth: Payer: Self-pay | Admitting: Family Medicine

## 2023-01-17 DIAGNOSIS — Z0000 Encounter for general adult medical examination without abnormal findings: Secondary | ICD-10-CM

## 2023-01-17 DIAGNOSIS — Z79899 Other long term (current) drug therapy: Secondary | ICD-10-CM

## 2023-01-17 MED ORDER — FLUTICASONE PROPIONATE 50 MCG/ACT NA SUSP: 1.0000 | Freq: Every day | NASAL | 3 refills | Status: AC

## 2023-01-17 NOTE — Telephone Encounter (Signed)
Patient is scheduled for CPE on 02/22/23 @ 10:30am. Patient requests labs be completed prior to CPE if possible. Please Advise.

## 2023-01-17 NOTE — Telephone Encounter (Signed)
Vml for pt to call back and sch labs

## 2023-01-17 NOTE — Addendum Note (Signed)
Addended by: Billey Chang on: 01/17/2023 01:23 PM   Modules accepted: Orders

## 2023-01-20 DIAGNOSIS — G4733 Obstructive sleep apnea (adult) (pediatric): Secondary | ICD-10-CM | POA: Diagnosis not present

## 2023-01-24 ENCOUNTER — Other Ambulatory Visit (INDEPENDENT_AMBULATORY_CARE_PROVIDER_SITE_OTHER): Payer: Medicare HMO

## 2023-01-24 DIAGNOSIS — Z0000 Encounter for general adult medical examination without abnormal findings: Secondary | ICD-10-CM | POA: Diagnosis not present

## 2023-01-24 LAB — COMPREHENSIVE METABOLIC PANEL
ALT: 16 U/L (ref 0–53)
AST: 15 U/L (ref 0–37)
Albumin: 4.1 g/dL (ref 3.5–5.2)
Alkaline Phosphatase: 45 U/L (ref 39–117)
BUN: 20 mg/dL (ref 6–23)
CO2: 29 mEq/L (ref 19–32)
Calcium: 9.7 mg/dL (ref 8.4–10.5)
Chloride: 104 mEq/L (ref 96–112)
Creatinine, Ser: 1.1 mg/dL (ref 0.40–1.50)
GFR: 66.97 mL/min (ref >60.00)
Glucose, Bld: 98 mg/dL (ref 70–99)
Potassium: 4.2 mEq/L (ref 3.5–5.1)
Sodium: 142 mEq/L (ref 135–145)
Total Bilirubin: 0.6 mg/dL (ref 0.2–1.2)
Total Protein: 6.5 g/dL (ref 6.0–8.3)

## 2023-01-24 LAB — CBC WITH DIFFERENTIAL/PLATELET
Basophils Absolute: 0 10*3/uL (ref 0.0–0.1)
Basophils Relative: 0.6 % (ref 0.0–3.0)
Eosinophils Absolute: 0.1 10*3/uL (ref 0.0–0.7)
Eosinophils Relative: 1.9 % (ref 0.0–5.0)
HCT: 45.3 % (ref 39.0–52.0)
Hemoglobin: 15.3 g/dL (ref 13.0–17.0)
Lymphocytes Relative: 31.8 % (ref 12.0–46.0)
Lymphs Abs: 1.3 10*3/uL (ref 0.7–4.0)
MCHC: 33.9 g/dL (ref 30.0–36.0)
MCV: 94.6 fl (ref 78.0–100.0)
Monocytes Absolute: 0.4 10*3/uL (ref 0.1–1.0)
Monocytes Relative: 9.3 % (ref 3.0–12.0)
Neutro Abs: 2.4 10*3/uL (ref 1.4–7.7)
Neutrophils Relative %: 56.4 % (ref 43.0–77.0)
Platelets: 214 10*3/uL (ref 150.0–400.0)
RBC: 4.79 Mil/uL (ref 4.22–5.81)
RDW: 13.6 % (ref 11.5–15.5)
WBC: 4.2 10*3/uL (ref 4.0–10.5)

## 2023-01-24 LAB — LIPID PANEL
Cholesterol: 243 mg/dL — ABNORMAL HIGH (ref 0–200)
HDL: 43.3 mg/dL (ref >39.00)
LDL Cholesterol: 176 mg/dL — ABNORMAL HIGH (ref 0–99)
NonHDL: 199.32
Total CHOL/HDL Ratio: 6
Triglycerides: 119 mg/dL (ref 0.0–149.0)
VLDL: 23.8 mg/dL (ref 0.0–40.0)

## 2023-01-24 LAB — TSH: TSH: 2.63 u[IU]/mL (ref 0.35–5.50)

## 2023-01-24 LAB — PSA, MEDICARE: PSA: 4.26 ng/ml — ABNORMAL HIGH (ref 0.10–4.00)

## 2023-01-25 ENCOUNTER — Encounter: Payer: Self-pay | Admitting: Family Medicine

## 2023-01-25 NOTE — Addendum Note (Signed)
Addended by: Billey Chang on: 01/25/2023 11:56 AM   Modules accepted: Orders

## 2023-01-25 NOTE — Progress Notes (Signed)
Please add on vit b12, dx: current long term use of proton pump inhibitor. I have ordered it.  Thanks, Dr. Jonni Sanger '

## 2023-02-02 DIAGNOSIS — R972 Elevated prostate specific antigen [PSA]: Secondary | ICD-10-CM | POA: Diagnosis not present

## 2023-02-09 DIAGNOSIS — N5201 Erectile dysfunction due to arterial insufficiency: Secondary | ICD-10-CM | POA: Diagnosis not present

## 2023-02-09 DIAGNOSIS — R972 Elevated prostate specific antigen [PSA]: Secondary | ICD-10-CM | POA: Diagnosis not present

## 2023-02-09 DIAGNOSIS — N401 Enlarged prostate with lower urinary tract symptoms: Secondary | ICD-10-CM | POA: Diagnosis not present

## 2023-02-09 DIAGNOSIS — R3911 Hesitancy of micturition: Secondary | ICD-10-CM | POA: Diagnosis not present

## 2023-02-09 DIAGNOSIS — R3912 Poor urinary stream: Secondary | ICD-10-CM | POA: Diagnosis not present

## 2023-02-10 DIAGNOSIS — D1801 Hemangioma of skin and subcutaneous tissue: Secondary | ICD-10-CM | POA: Diagnosis not present

## 2023-02-10 DIAGNOSIS — L814 Other melanin hyperpigmentation: Secondary | ICD-10-CM | POA: Diagnosis not present

## 2023-02-10 DIAGNOSIS — L570 Actinic keratosis: Secondary | ICD-10-CM | POA: Diagnosis not present

## 2023-02-10 DIAGNOSIS — L578 Other skin changes due to chronic exposure to nonionizing radiation: Secondary | ICD-10-CM | POA: Diagnosis not present

## 2023-02-10 DIAGNOSIS — L821 Other seborrheic keratosis: Secondary | ICD-10-CM | POA: Diagnosis not present

## 2023-02-10 DIAGNOSIS — L820 Inflamed seborrheic keratosis: Secondary | ICD-10-CM | POA: Diagnosis not present

## 2023-02-22 ENCOUNTER — Encounter: Payer: Medicare Other | Admitting: Family Medicine

## 2023-02-23 DIAGNOSIS — H1132 Conjunctival hemorrhage, left eye: Secondary | ICD-10-CM | POA: Diagnosis not present

## 2023-02-23 DIAGNOSIS — H2513 Age-related nuclear cataract, bilateral: Secondary | ICD-10-CM | POA: Diagnosis not present

## 2023-02-23 DIAGNOSIS — H52203 Unspecified astigmatism, bilateral: Secondary | ICD-10-CM | POA: Diagnosis not present

## 2023-03-15 ENCOUNTER — Encounter: Payer: Medicare Other | Admitting: Family Medicine

## 2023-03-22 ENCOUNTER — Other Ambulatory Visit: Payer: Self-pay | Admitting: Urology

## 2023-03-28 ENCOUNTER — Other Ambulatory Visit: Payer: Self-pay

## 2023-03-28 ENCOUNTER — Encounter: Payer: Self-pay | Admitting: Family Medicine

## 2023-03-28 MED ORDER — LEVOCETIRIZINE DIHYDROCHLORIDE 5 MG PO TABS: 5.0000 mg | ORAL_TABLET | Freq: Every evening | ORAL | 0 refills | Status: DC

## 2023-03-28 NOTE — Telephone Encounter (Signed)
Rx sent 

## 2023-04-04 ENCOUNTER — Ambulatory Visit (INDEPENDENT_AMBULATORY_CARE_PROVIDER_SITE_OTHER): Payer: Medicare HMO | Admitting: Family Medicine

## 2023-04-04 ENCOUNTER — Encounter: Payer: Self-pay | Admitting: Family Medicine

## 2023-04-04 VITALS — BP 126/62 | HR 65 | Temp 98.1°F | Ht 70.0 in | Wt 187.8 lb

## 2023-04-04 DIAGNOSIS — M159 Polyosteoarthritis, unspecified: Secondary | ICD-10-CM

## 2023-04-04 DIAGNOSIS — G4733 Obstructive sleep apnea (adult) (pediatric): Secondary | ICD-10-CM

## 2023-04-04 DIAGNOSIS — M150 Primary generalized (osteo)arthritis: Secondary | ICD-10-CM

## 2023-04-04 DIAGNOSIS — Z0000 Encounter for general adult medical examination without abnormal findings: Secondary | ICD-10-CM

## 2023-04-04 DIAGNOSIS — K219 Gastro-esophageal reflux disease without esophagitis: Secondary | ICD-10-CM

## 2023-04-04 DIAGNOSIS — R09A2 Foreign body sensation, throat: Secondary | ICD-10-CM

## 2023-04-04 DIAGNOSIS — M47812 Spondylosis without myelopathy or radiculopathy, cervical region: Secondary | ICD-10-CM | POA: Insufficient documentation

## 2023-04-04 DIAGNOSIS — N401 Enlarged prostate with lower urinary tract symptoms: Secondary | ICD-10-CM

## 2023-04-04 DIAGNOSIS — G720 Drug-induced myopathy: Secondary | ICD-10-CM

## 2023-04-04 DIAGNOSIS — M4722 Other spondylosis with radiculopathy, cervical region: Secondary | ICD-10-CM

## 2023-04-04 DIAGNOSIS — N138 Other obstructive and reflux uropathy: Secondary | ICD-10-CM | POA: Diagnosis not present

## 2023-04-04 DIAGNOSIS — E7849 Other hyperlipidemia: Secondary | ICD-10-CM | POA: Diagnosis not present

## 2023-04-04 DIAGNOSIS — J310 Chronic rhinitis: Secondary | ICD-10-CM | POA: Diagnosis not present

## 2023-04-04 DIAGNOSIS — J301 Allergic rhinitis due to pollen: Secondary | ICD-10-CM

## 2023-04-04 DIAGNOSIS — M4802 Spinal stenosis, cervical region: Secondary | ICD-10-CM | POA: Insufficient documentation

## 2023-04-04 DIAGNOSIS — G5621 Lesion of ulnar nerve, right upper limb: Secondary | ICD-10-CM

## 2023-04-04 MED ORDER — MONTELUKAST SODIUM 10 MG PO TABS: 10.0000 mg | ORAL_TABLET | Freq: Every day | ORAL | 3 refills | Status: DC

## 2023-04-04 NOTE — Progress Notes (Signed)
Subjective  Chief Complaint  Patient presents with   Annual Exam    Pt here for Annual exam and is not currently fasting     HPI: Eugene Mcdaniel is a 72 y.o. male who presents to [facility] [place] today for a Male Wellness Visit. He also has the concerns and/or needs as listed above in the chief complaint. These will be addressed in addition to the Health Maintenance Visit.   Wellness Visit: annual visit with health maintenance review and exam   Health maintenance: Screens are current.  Healthy lifestyle.  He is a Teacher, English as a foreign language, keeps active, healthy diet.  Immunizations are current.  Happy, daughter had first child, happy to be a new grandparent.  Here with his wife today.  Body mass index is 26.95 kg/m. Wt Readings from Last 3 Encounters:  04/04/23 187 lb 12.8 oz (85.2 kg)  06/22/22 183 lb 9.6 oz (83.3 kg)  03/29/22 186 lb (84.4 kg)     Chronic disease management visit and/or acute problem visit: Stays cold all the time.  Bundles up.  No fevers, chills, lower extremity edema.  Energy level is good.  No anemia and normal thyroid by recent lab work.  See below. Chronic GERD and dyspepsia with throat clearing.  Also with chronic allergies.  Has history of globus pharyngeus.  Has seen GI as well.  No longer on PPI.  Takes allergy medicines: Xyzal and Flonase. Sleep apnea with CPAP is well-controlled pH with obstruction on Flomax.  Reviewed recent urology notes.  Stable Refuses statins, history of statin arthropathy. Osteoarthritis: Bilateral hands, cervical spine and lumbar spine.  Manages.  Has been to neurology and neurosurgery this year.  Reviewed notes.  Cervical radiculopathy and ulnar neuropathy confirmed by EMG/NCV studies.  Has had physical therapy.  He has been to [facility] regarding his hand osteoarthritis.  Not currently ready to have any procedures done for that.  Wife says he drops things but patient reports it is manageable.  Assessment  1. Annual physical exam    2. Gastroesophageal reflux disease without esophagitis   3. Familial hyperlipidemia   4. OSA on CPAP   5. BPH with urinary obstruction   6. Statin myopathy   7. Seasonal allergic rhinitis due to pollen   8. Rhinitis, chronic   9. 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 Osteoarthritis of spine with radiculopathy, cervical region   12. Ulnar neuropathy at elbow, right      Plan  Male Wellness Visit: Age appropriate Health Maintenance and Prevention measures were discussed with patient. Included topics are cancer screening recommendations, ways to keep healthy (see AVS) including dietary and exercise recommendations, regular eye and dental care, use of seat belts, and avoidance of moderate alcohol use and tobacco use.  BMI: discussed patient's BMI and encouraged positive lifestyle modifications to help get to or maintain a target BMI. HM needs and immunizations were addressed and ordered. See below for orders. See HM and immunization section for updates. Routine labs and screening tests ordered including cmp, cbc and lipids where appropriate. Discussed recommendations regarding Vit D and calcium supplementation (see AVS)  Chronic disease f/u and/or acute problem visit: (deemed necessary to be done in addition to the wellness visit): GERD/dyspepsia: No longer on PPI.  Could be contributing to some of his symptoms.  Will monitor Chronic allergies: Add Singulair to Flonase and Xyzal.  Refer to allergist. BPH on Flomax, well-controlled.  Sees urology annually. Osteoarthritis:  Multiple sites.  Discussed management. History of hyperlipidemia: No longer recommend further testing as patient does not want medical therapy.   Follow up: 12 months for complete physical Commons side effects, risks, benefits, and alternatives for medications and treatment plan prescribed today were discussed, and the patient expressed understanding of the given instructions.  Patient is instructed to call or message via MyChart if he/she has any questions or concerns regarding our treatment plan. No barriers to understanding were identified. We discussed Red Flag symptoms and signs in detail. Patient expressed understanding regarding what to do in case of urgent or emergency type symptoms.  Medication list was reconciled, printed and provided to the patient in AVS. Patient instructions and summary information was reviewed with the patient as documented in the AVS. This note was prepared with assistance of Dragon voice recognition software. Occasional wrong-word or sound-a-like substitutions may have occurred due to the inherent limitations of voice recognition software  Orders Placed This Encounter  Procedures   Ambulatory referral to Allergy   Meds ordered this encounter  Medications   montelukast (SINGULAIR) 10 MG tablet    Sig: Take 1 tablet (10 mg total) by mouth at bedtime.    Dispense:  90 tablet    Refill:  3     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/28/2017   Ulnar neuropathy at elbow, right 04/04/2023   DJD (degenerative joint disease) of cervical spine 04/04/2023   Myalgia due to statin 08/12/2020   Hyperplastic colon polyp 12/28/2017   Constipation 06/15/2017   BPH with urinary obstruction 03/28/2017   Primary osteoarthritis involving multiple joints 03/28/2017   Umbilical hernia without obstruction and without gangrene 06/18/2019   Rhinitis, chronic 01/17/2018   Seasonal allergic rhinitis due to pollen 03/28/2017   Tendinitis of both rotator cuffs 10/21/2015   Globus pharyngeus 05/25/2019   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   Health Maintenance  Topic Date Due   Medicare Annual Wellness  (AWV)  Never done   COLONOSCOPY (Pts 45-32yrs Insurance coverage will need to be confirmed)  09/10/2027   DTaP/Tdap/Td (3 - Td or Tdap) 08/29/2032   Pneumonia Vaccine 5+ Years old  Completed   Hepatitis C Screening  Completed   Zoster Vaccines- Shingrix  Completed   HPV VACCINES  Aged Out   INFLUENZA VACCINE  Discontinued   COVID-19 Vaccine  Discontinued   Immunization History  Administered Date(s) Administered   Moderna Sars-Covid-2 Vaccination 02/09/2020, 03/11/2020   PNEUMOCOCCAL CONJUGATE-20 11/25/2021   Pneumococcal Polysaccharide-23 08/12/2020   Tdap 02/28/2016, 08/29/2022   Zoster Recombinat (Shingrix) 05/19/2018, 07/20/2018, 08/07/2018   We updated and reviewed the patient's past history in detail and it is documented below. Allergies: Patient is allergic to atorvastatin and simvastatin. Past Medical History  has a past medical history of Allergy, Colon polyp, GERD (gastroesophageal reflux disease), Hypercholesterolemia, Hyperplastic colon polyp (12/28/2017), Primary osteoarthritis involving multiple joints, Seasonal allergic rhinitis due to pollen, and Sleep apnea. Past Surgical History Patient  has a past surgical history that includes Appendectomy; colonoscopy  (06/2005); Tonsillectomy and adenoidectomy (1962); and Medial collateral ligament repair, knee (Right). Social History Patient  reports that he quit smoking about 36 years ago. His smoking use included cigarettes. He has a 60.00 pack-year smoking history. He has quit using  smokeless tobacco. He reports current alcohol use. He reports that he does not use drugs. Family History family history includes Arthritis in his father; Healthy in his daughter and sister; Heart disease in his father; Lung cancer in his mother. Review of Systems: Constitutional: negative for fever or malaise Ophthalmic: negative for photophobia, double vision or loss of vision Cardiovascular: negative for chest pain, dyspnea on exertion, or new LE  swelling Respiratory: negative for SOB or persistent cough Gastrointestinal: negative for abdominal pain, change in bowel habits or melena Genitourinary: negative for dysuria or gross hematuria Musculoskeletal: negative for new gait disturbance or muscular weakness Integumentary: negative for new or persistent rashes Neurological: negative for TIA or stroke symptoms Psychiatric: negative for SI or delusions Allergic/Immunologic: negative for hives  Patient Care Team    Relationship Specialty Notifications Start End  Willow Ora, MD PCP - General Family Medicine  12/27/17   Bjorn Pippin, MD Attending Physician Urology  12/27/17   Belva Agee, MD Consulting Physician Gastroenterology  12/28/17   Deretha Emory, MD (Inactive) Attending Physician Pulmonary Disease  06/18/19   Osborn Coho, MD (Inactive) Consulting Physician Otolaryngology  06/18/19   Ferdinand Cava, MD Referring Physician Family Medicine  08/12/20   Aris Lot, MD Consulting Physician Dermatology  08/12/20   Keith Rake, MD Consulting Physician Neurosurgery  04/04/23    Objective  Vitals: BP 126/62   Pulse 65   Temp 98.1 F (36.7 C)   Ht 5\' 10"  (1.778 m)   Wt 187 lb 12.8 oz (85.2 kg)   SpO2 96%   BMI 26.95 kg/m  General:  Well developed, well nourished, no acute distress  Psych:  Alert and orientedx3,normal mood and affect HEENT:  Normocephalic, atraumatic, non-icteric sclera,  oropharynx is clear without mass or exudate, supple neck without adenopathy, or thyromegaly Cardiovascular:  Normal S1, S2, RRR without gallop, rub or murmur,  Respiratory:  Good breath sounds bilaterally, CTAB with normal respiratory effort Gastrointestinal: normal bowel sounds, soft, non-tender, no noted masses. No HSM MSK: Joints are without erythema or swelling.  Skin:  Warm, no rashes, multiple sun related skin changes.  He does see dermatology annually Neurologic:    Mental status is normal.  Gross motor and sensory exams  are normal. Stable gait. No tremor   No visits with results within 1 Day(s) from this visit.  Latest known visit with results is:  Lab on 01/24/2023  Component Date Value Ref Range Status   WBC 01/24/2023 4.2  4.0 - 10.5 K/uL Final   RBC 01/24/2023 4.79  4.22 - 5.81 Mil/uL Final   Hemoglobin 01/24/2023 15.3  13.0 - 17.0 g/dL Final   HCT 16/08/9603 45.3  39.0 - 52.0 % Final   MCV 01/24/2023 94.6  78.0 - 100.0 fl Final   MCHC 01/24/2023 33.9  30.0 - 36.0 g/dL Final   RDW 54/07/8118 13.6  11.5 - 15.5 % Final   Platelets 01/24/2023 214.0  150.0 - 400.0 K/uL Final   Neutrophils Relative % 01/24/2023 56.4  43.0 - 77.0 % Final   Lymphocytes Relative 01/24/2023 31.8  12.0 - 46.0 % Final   Monocytes Relative 01/24/2023 9.3  3.0 - 12.0 % Final   Eosinophils Relative 01/24/2023 1.9  0.0 - 5.0 % Final   Basophils Relative 01/24/2023 0.6  0.0 - 3.0 % Final   Neutro Abs 01/24/2023 2.4  1.4 - 7.7 K/uL Final   Lymphs Abs 01/24/2023 1.3  0.7 - 4.0 K/uL Final   Monocytes Absolute  01/24/2023 0.4  0.1 - 1.0 K/uL Final   Eosinophils Absolute 01/24/2023 0.1  0.0 - 0.7 K/uL Final   Basophils Absolute 01/24/2023 0.0  0.0 - 0.1 K/uL Final   Sodium 01/24/2023 142  135 - 145 mEq/L Final   Potassium 01/24/2023 4.2  3.5 - 5.1 mEq/L Final   Chloride 01/24/2023 104  96 - 112 mEq/L Final   CO2 01/24/2023 29  19 - 32 mEq/L Final   Glucose, Bld 01/24/2023 98  70 - 99 mg/dL Final   BUN 16/08/9603 20  6 - 23 mg/dL Final   Creatinine, Ser 01/24/2023 1.10  0.40 - 1.50 mg/dL Final   Total Bilirubin 01/24/2023 0.6  0.2 - 1.2 mg/dL Final   Alkaline Phosphatase 01/24/2023 45  39 - 117 U/L Final   AST 01/24/2023 15  0 - 37 U/L Final   ALT 01/24/2023 16  0 - 53 U/L Final   Total Protein 01/24/2023 6.5  6.0 - 8.3 g/dL Final   Albumin 54/07/8118 4.1  3.5 - 5.2 g/dL Final   GFR 14/78/2956 66.97  >60.00 mL/min Final   Calcium 01/24/2023 9.7  8.4 - 10.5 mg/dL Final   Cholesterol 21/30/8657 243 (H)  0 - 200 mg/dL Final    Triglycerides 01/24/2023 119.0  0.0 - 149.0 mg/dL Final   HDL 84/69/6295 43.30  >39.00 mg/dL Final   VLDL 28/41/3244 23.8  0.0 - 40.0 mg/dL Final   LDL Cholesterol 01/24/2023 176 (H)  0 - 99 mg/dL Final   Total CHOL/HDL Ratio 01/24/2023 6   Final   NonHDL 01/24/2023 199.32   Final   TSH 01/24/2023 2.63  0.35 - 5.50 uIU/mL Final   PSA 01/24/2023 4.26 (H)  0.10 - 4.00 ng/ml Final

## 2023-04-04 NOTE — Patient Instructions (Addendum)
Please return in 12 months for your annual complete physical; please come fasting.   If you have any questions or concerns, please don't hesitate to send me a message via MyChart or call the office at [phone]. Thank you for visiting with [place] today! It's our pleasure caring for you.   We will call you with information regarding your referral appointment. Allergy and Asthma specialists in [place].  If you do not hear from [place] within the next 2 weeks, please let me know. It can take 1-2 weeks to get appointments set up with the specialists.   Start the singulair to see if that helps.   Please do these things to maintain good health!  Exercise at least 30-45 minutes a day,  4-5 days a week.  Eat a low-fat diet with lots of fruits and vegetables, up to 7-9 servings per day. Drink plenty of water daily. Try to drink 8 8oz glasses per day. Seatbelts can save your life. Always wear your seatbelt. Place Smoke Detectors on every level of your home and check batteries every year. Eye Doctor - have an eye exam every 1-2 years Safe sex - use condoms to protect yourself from STDs if you could be exposed to these types of infections. Avoid heavy alcohol use. If you drink, keep it to less than 2 drinks/day and not every day. Health Care Power of Attorney.  Choose someone you trust that could speak for you if you became unable to speak for yourself. Depression is common in our stressful world.If you're feeling down or losing interest in things you normally enjoy, please come in for a visit.

## 2023-04-30 DIAGNOSIS — R69 Illness, unspecified: Secondary | ICD-10-CM | POA: Diagnosis not present

## 2023-05-03 DIAGNOSIS — R69 Illness, unspecified: Secondary | ICD-10-CM | POA: Diagnosis not present

## 2023-05-06 DIAGNOSIS — R69 Illness, unspecified: Secondary | ICD-10-CM | POA: Diagnosis not present

## 2023-05-07 DIAGNOSIS — R69 Illness, unspecified: Secondary | ICD-10-CM | POA: Diagnosis not present

## 2023-05-09 ENCOUNTER — Telehealth: Payer: Self-pay | Admitting: Family Medicine

## 2023-05-09 DIAGNOSIS — R69 Illness, unspecified: Secondary | ICD-10-CM | POA: Diagnosis not present

## 2023-05-09 NOTE — Telephone Encounter (Signed)
Pt would like a referral to Gastro. He states he has talked to you about the referral. Please advise.

## 2023-05-10 ENCOUNTER — Other Ambulatory Visit: Payer: Self-pay

## 2023-05-10 DIAGNOSIS — R1013 Epigastric pain: Secondary | ICD-10-CM

## 2023-05-10 NOTE — Telephone Encounter (Signed)
Spoke with pt and referral has been placed.

## 2023-05-11 DIAGNOSIS — R69 Illness, unspecified: Secondary | ICD-10-CM | POA: Diagnosis not present

## 2023-05-19 ENCOUNTER — Other Ambulatory Visit: Payer: Self-pay

## 2023-05-19 ENCOUNTER — Encounter: Payer: Self-pay | Admitting: Family Medicine

## 2023-05-19 DIAGNOSIS — R69 Illness, unspecified: Secondary | ICD-10-CM | POA: Diagnosis not present

## 2023-05-19 MED ORDER — OMEPRAZOLE MAGNESIUM 20 MG PO TBEC: 20.0000 mg | DELAYED_RELEASE_TABLET | Freq: Every day | ORAL | 3 refills | Status: DC

## 2023-05-20 DIAGNOSIS — R69 Illness, unspecified: Secondary | ICD-10-CM | POA: Diagnosis not present

## 2023-05-23 DIAGNOSIS — R69 Illness, unspecified: Secondary | ICD-10-CM | POA: Diagnosis not present

## 2023-05-24 DIAGNOSIS — R69 Illness, unspecified: Secondary | ICD-10-CM | POA: Diagnosis not present

## 2023-05-26 DIAGNOSIS — R69 Illness, unspecified: Secondary | ICD-10-CM | POA: Diagnosis not present

## 2023-05-28 DIAGNOSIS — R69 Illness, unspecified: Secondary | ICD-10-CM | POA: Diagnosis not present

## 2023-05-29 DIAGNOSIS — R69 Illness, unspecified: Secondary | ICD-10-CM | POA: Diagnosis not present

## 2023-05-30 DIAGNOSIS — R69 Illness, unspecified: Secondary | ICD-10-CM | POA: Diagnosis not present

## 2023-05-30 MED ORDER — OMEPRAZOLE 20 MG PO CPDR: 20.0000 mg | DELAYED_RELEASE_CAPSULE | Freq: Every day | ORAL | 3 refills | Status: DC

## 2023-05-31 DIAGNOSIS — R69 Illness, unspecified: Secondary | ICD-10-CM | POA: Diagnosis not present

## 2023-06-06 DIAGNOSIS — R69 Illness, unspecified: Secondary | ICD-10-CM | POA: Diagnosis not present

## 2023-06-07 DIAGNOSIS — R69 Illness, unspecified: Secondary | ICD-10-CM | POA: Diagnosis not present

## 2023-06-08 DIAGNOSIS — R69 Illness, unspecified: Secondary | ICD-10-CM | POA: Diagnosis not present

## 2023-06-10 DIAGNOSIS — R69 Illness, unspecified: Secondary | ICD-10-CM | POA: Diagnosis not present

## 2023-06-13 DIAGNOSIS — R69 Illness, unspecified: Secondary | ICD-10-CM | POA: Diagnosis not present

## 2023-06-15 DIAGNOSIS — R69 Illness, unspecified: Secondary | ICD-10-CM | POA: Diagnosis not present

## 2023-06-16 DIAGNOSIS — R69 Illness, unspecified: Secondary | ICD-10-CM | POA: Diagnosis not present

## 2023-06-17 DIAGNOSIS — R69 Illness, unspecified: Secondary | ICD-10-CM | POA: Diagnosis not present

## 2023-06-20 DIAGNOSIS — R69 Illness, unspecified: Secondary | ICD-10-CM | POA: Diagnosis not present

## 2023-06-21 DIAGNOSIS — R69 Illness, unspecified: Secondary | ICD-10-CM | POA: Diagnosis not present

## 2023-06-22 DIAGNOSIS — R293 Abnormal posture: Secondary | ICD-10-CM | POA: Diagnosis not present

## 2023-06-22 DIAGNOSIS — M25552 Pain in left hip: Secondary | ICD-10-CM | POA: Diagnosis not present

## 2023-06-22 DIAGNOSIS — R262 Difficulty in walking, not elsewhere classified: Secondary | ICD-10-CM | POA: Diagnosis not present

## 2023-06-22 DIAGNOSIS — M5432 Sciatica, left side: Secondary | ICD-10-CM | POA: Diagnosis not present

## 2023-06-22 DIAGNOSIS — M5187 Other intervertebral disc disorders, lumbosacral region: Secondary | ICD-10-CM | POA: Diagnosis not present

## 2023-06-22 DIAGNOSIS — M25652 Stiffness of left hip, not elsewhere classified: Secondary | ICD-10-CM | POA: Diagnosis not present

## 2023-06-27 DIAGNOSIS — M5432 Sciatica, left side: Secondary | ICD-10-CM | POA: Diagnosis not present

## 2023-06-27 DIAGNOSIS — R262 Difficulty in walking, not elsewhere classified: Secondary | ICD-10-CM | POA: Diagnosis not present

## 2023-06-27 DIAGNOSIS — M5187 Other intervertebral disc disorders, lumbosacral region: Secondary | ICD-10-CM | POA: Diagnosis not present

## 2023-06-27 DIAGNOSIS — M4802 Spinal stenosis, cervical region: Secondary | ICD-10-CM | POA: Diagnosis not present

## 2023-06-27 DIAGNOSIS — M25552 Pain in left hip: Secondary | ICD-10-CM | POA: Diagnosis not present

## 2023-06-27 DIAGNOSIS — M25652 Stiffness of left hip, not elsewhere classified: Secondary | ICD-10-CM | POA: Diagnosis not present

## 2023-06-27 DIAGNOSIS — M5412 Radiculopathy, cervical region: Secondary | ICD-10-CM | POA: Diagnosis not present

## 2023-06-27 DIAGNOSIS — R293 Abnormal posture: Secondary | ICD-10-CM | POA: Diagnosis not present

## 2023-06-28 DIAGNOSIS — R69 Illness, unspecified: Secondary | ICD-10-CM | POA: Diagnosis not present

## 2023-06-29 DIAGNOSIS — R69 Illness, unspecified: Secondary | ICD-10-CM | POA: Diagnosis not present

## 2023-06-30 DIAGNOSIS — M25652 Stiffness of left hip, not elsewhere classified: Secondary | ICD-10-CM | POA: Diagnosis not present

## 2023-06-30 DIAGNOSIS — M25552 Pain in left hip: Secondary | ICD-10-CM | POA: Diagnosis not present

## 2023-06-30 DIAGNOSIS — M5187 Other intervertebral disc disorders, lumbosacral region: Secondary | ICD-10-CM | POA: Diagnosis not present

## 2023-06-30 DIAGNOSIS — R293 Abnormal posture: Secondary | ICD-10-CM | POA: Diagnosis not present

## 2023-06-30 DIAGNOSIS — R262 Difficulty in walking, not elsewhere classified: Secondary | ICD-10-CM | POA: Diagnosis not present

## 2023-06-30 DIAGNOSIS — M5432 Sciatica, left side: Secondary | ICD-10-CM | POA: Diagnosis not present

## 2023-08-02 DIAGNOSIS — J3089 Other allergic rhinitis: Secondary | ICD-10-CM | POA: Diagnosis not present

## 2023-08-02 DIAGNOSIS — J301 Allergic rhinitis due to pollen: Secondary | ICD-10-CM | POA: Diagnosis not present

## 2023-08-03 DIAGNOSIS — G5622 Lesion of ulnar nerve, left upper limb: Secondary | ICD-10-CM | POA: Diagnosis not present

## 2023-08-03 DIAGNOSIS — Z6826 Body mass index (BMI) 26.0-26.9, adult: Secondary | ICD-10-CM | POA: Diagnosis not present

## 2023-08-03 DIAGNOSIS — M4722 Other spondylosis with radiculopathy, cervical region: Secondary | ICD-10-CM | POA: Diagnosis not present

## 2023-09-19 DIAGNOSIS — H0014 Chalazion left upper eyelid: Secondary | ICD-10-CM | POA: Diagnosis not present

## 2023-10-03 DIAGNOSIS — R69 Illness, unspecified: Secondary | ICD-10-CM | POA: Diagnosis not present

## 2023-10-04 DIAGNOSIS — R69 Illness, unspecified: Secondary | ICD-10-CM | POA: Diagnosis not present

## 2023-10-10 DIAGNOSIS — R69 Illness, unspecified: Secondary | ICD-10-CM | POA: Diagnosis not present

## 2023-10-11 DIAGNOSIS — R69 Illness, unspecified: Secondary | ICD-10-CM | POA: Diagnosis not present

## 2023-10-12 DIAGNOSIS — R69 Illness, unspecified: Secondary | ICD-10-CM | POA: Diagnosis not present

## 2023-10-17 ENCOUNTER — Other Ambulatory Visit: Payer: Self-pay | Admitting: Neurological Surgery

## 2023-10-19 ENCOUNTER — Other Ambulatory Visit: Payer: Self-pay | Admitting: Family Medicine

## 2023-11-04 DIAGNOSIS — Z0100 Encounter for examination of eyes and vision without abnormal findings: Secondary | ICD-10-CM | POA: Diagnosis not present

## 2023-12-05 ENCOUNTER — Other Ambulatory Visit: Payer: Self-pay | Admitting: Neurological Surgery

## 2023-12-05 NOTE — Pre-Procedure Instructions (Signed)
 Surgical Instructions   Your procedure is scheduled on Thursday, January 9th. Report to [facility] Main Entrance A at 08:50 A.M., then check in with the Admitting office. Any questions or running late day of surgery: call [phone]  Questions prior to your surgery date: call [phone], Monday-Friday, 8am-4pm. If you experience any cold or flu symptoms such as cough, fever, chills, shortness of breath, etc. between now and your scheduled surgery, please notify us  at the above number.     Remember:  Do not eat or drink after midnight the night before your surgery    Take these medicines the morning of surgery with A SIP OF WATER  doxazosin  (CARDURA )  fluticasone  (FLONASE )  montelukast  (SINGULAIR )  omeprazole  (PRILOSEC )   May take these medicines IF NEEDED: carboxymethylcellulose (REFRESH PLUS)  oxymetazoline (AFRIN)    One week prior to surgery, STOP taking any Aspirin (unless otherwise instructed by your surgeon) Aleve, Naproxen, Ibuprofen, Motrin, Advil, Goody's, BC's, all herbal medications, fish oil, and non-prescription vitamins.                     Do NOT Smoke (Tobacco/Vaping) for 24 hours prior to your procedure.  If you use a CPAP at night, you may bring your mask/headgear for your overnight stay.   You will be asked to remove any contacts, glasses, piercing's, hearing aid's, dentures/partials prior to surgery. Please bring cases for these items if needed.    Patients discharged the day of surgery will not be allowed to drive home, and someone needs to stay with them for 24 hours.  SURGICAL WAITING ROOM VISITATION Patients may have no more than 2 support people in the waiting area - these visitors may rotate.   Pre-op nurse will coordinate an appropriate time for 1 ADULT support person, who may not rotate, to accompany patient in pre-op.  Children under the age of 72 must have an adult with them who is not the patient and must remain in the main waiting area  with an adult.  If the patient needs to stay at the hospital during part of their recovery, the visitor guidelines for inpatient rooms apply.  Please refer to the [facility] website for the visitor guidelines for any additional information.   If you received a COVID test during your pre-op visit  it is requested that you wear a mask when out in public, stay away from anyone that may not be feeling well and notify your surgeon if you develop symptoms. If you have been in contact with anyone that has tested positive in the last 10 days please notify you surgeon.      Pre-operative 5 CHG Bathing Instructions   You can play a key role in reducing the risk of infection after surgery. Your skin needs to be as free of germs as possible. You can reduce the number of germs on your skin by washing with CHG (chlorhexidine  gluconate) soap before surgery. CHG is an antiseptic soap that kills germs and continues to kill germs even after washing.   DO NOT use if you have an allergy to chlorhexidine /CHG or antibacterial soaps. If your skin becomes reddened or irritated, stop using the CHG and notify one of our RNs at [phone].   Please shower with the CHG soap starting 4 days before surgery using the following schedule:     Please keep in mind the following:  DO NOT shave, including legs and underarms, starting the day of your first shower.  You may shave your face at any point before/day of surgery.  Place clean sheets on your bed the day you start using CHG soap. Use a clean washcloth (not used since being washed) for each shower. DO NOT sleep with pets once you start using the CHG.   CHG Shower Instructions:  Wash your face and private area with normal soap. If you choose to wash your hair, wash first with your normal shampoo.  After you use shampoo/soap, rinse your hair and body thoroughly to remove shampoo/soap residue.  Turn the water OFF and apply about 3 tablespoons (45 ml) of CHG soap  to a CLEAN washcloth.  Apply CHG soap ONLY FROM YOUR NECK DOWN TO YOUR TOES (washing for 3-5 minutes)  DO NOT use CHG soap on face, private areas, open wounds, or sores.  Pay special attention to the area where your surgery is being performed.  If you are having back surgery, having someone wash your back for you may be helpful. Wait 2 minutes after CHG soap is applied, then you may rinse off the CHG soap.  Pat dry with a clean towel  Put on clean clothes/pajamas   If you choose to wear lotion, please use ONLY the CHG-compatible lotions on the back of this paper.   Additional instructions for the day of surgery: DO NOT APPLY any lotions, deodorants, cologne, or perfumes.   Do not bring valuables to the hospital. [facility] is not responsible for any belongings/valuables. Do not wear nail polish, gel polish, artificial nails, or any other type of covering on natural nails (fingers and toes) Do not wear jewelry or makeup Put on clean/comfortable clothes.  Please brush your teeth.  Ask your nurse before applying any prescription medications to the skin.     CHG Compatible Lotions   Aveeno Moisturizing lotion  Cetaphil Moisturizing Cream  Cetaphil Moisturizing Lotion  Clairol Herbal Essence Moisturizing Lotion, Dry Skin  Clairol Herbal Essence Moisturizing Lotion, Extra Dry Skin  Clairol Herbal Essence Moisturizing Lotion, Normal Skin  Curel Age Defying Therapeutic Moisturizing Lotion with Alpha Hydroxy  Curel Extreme Care Body Lotion  Curel Soothing Hands Moisturizing Hand Lotion  Curel Therapeutic Moisturizing Cream, Fragrance-Free  Curel Therapeutic Moisturizing Lotion, Fragrance-Free  Curel Therapeutic Moisturizing Lotion, Original Formula  Eucerin Daily Replenishing Lotion  Eucerin Dry Skin Therapy Plus Alpha Hydroxy Crme  Eucerin Dry Skin Therapy Plus Alpha Hydroxy Lotion  Eucerin Original Crme  Eucerin Original Lotion  Eucerin Plus Crme Eucerin Plus Lotion  Eucerin  TriLipid Replenishing Lotion  Keri Anti-Bacterial Hand Lotion  Keri Deep Conditioning Original Lotion Dry Skin Formula Softly Scented  Keri Deep Conditioning Original Lotion, Fragrance Free Sensitive Skin Formula  Keri Lotion Fast Absorbing Fragrance Free Sensitive Skin Formula  Keri Lotion Fast Absorbing Softly Scented Dry Skin Formula  Keri Original Lotion  Keri Skin Renewal Lotion Keri Silky Smooth Lotion  Keri Silky Smooth Sensitive Skin Lotion  Nivea Body Creamy Conditioning Oil  Nivea Body Extra Enriched Lotion  Nivea Body Original Lotion  Nivea Body Sheer Moisturizing Lotion Nivea Crme  Nivea Skin Firming Lotion  NutraDerm 30 Skin Lotion  NutraDerm Skin Lotion  NutraDerm Therapeutic Skin Cream  NutraDerm Therapeutic Skin Lotion  ProShield Protective Hand Cream  Provon moisturizing lotion  Please read over the following fact sheets that you were given.

## 2023-12-06 ENCOUNTER — Encounter (HOSPITAL_COMMUNITY): Payer: Self-pay

## 2023-12-06 ENCOUNTER — Other Ambulatory Visit: Payer: Self-pay

## 2023-12-06 ENCOUNTER — Encounter (HOSPITAL_COMMUNITY)
Admission: RE | Admit: 2023-12-06 | Discharge: 2023-12-06 | Disposition: A | Discharge status: Home / Self Care | Payer: Medicare Other | Source: Ambulatory Visit | Attending: Neurological Surgery | Admitting: Neurological Surgery

## 2023-12-06 VITALS — BP 135/78 | HR 77 | Temp 97.9°F | Resp 18 | Ht 70.0 in | Wt 189.2 lb

## 2023-12-06 DIAGNOSIS — Z01812 Encounter for preprocedural laboratory examination: Secondary | ICD-10-CM | POA: Insufficient documentation

## 2023-12-06 DIAGNOSIS — Z01818 Encounter for other preprocedural examination: Secondary | ICD-10-CM

## 2023-12-06 LAB — CBC
HCT: 45.8 % (ref 39.0–52.0)
Hemoglobin: 15.4 g/dL (ref 13.0–17.0)
MCH: 32.1 pg (ref 26.0–34.0)
MCHC: 33.6 g/dL (ref 30.0–36.0)
MCV: 95.4 fL (ref 80.0–100.0)
Platelets: 231 10*3/uL (ref 150–400)
RBC: 4.8 MIL/uL (ref 4.22–5.81)
RDW: 13.8 % (ref 11.5–15.5)
WBC: 4 10*3/uL (ref 4.0–10.5)
nRBC: 0 % (ref 0.0–0.2)

## 2023-12-06 LAB — TYPE AND SCREEN
ABO/RH(D): A POS
Antibody Screen: NEGATIVE

## 2023-12-06 LAB — SURGICAL PCR SCREEN
MRSA, PCR: NEGATIVE
Staphylococcus aureus: NEGATIVE

## 2023-12-06 NOTE — Progress Notes (Signed)
 PCP - Jodie Lavern CROME, MD  Cardiologist -   PPM/ICD - denies Device Orders - n/a Rep Notified - n/a  Chest x-ray - denies EKG - denies Stress Test - denies ECHO - denies Cardiac Cath - denies  Sleep Study -  CPAP - wears nightly  DM - denies  Blood Thinner Instructions: denies Aspirin Instructions: n/a  ERAS Protcol - NPO   COVID TEST- n/a   Anesthesia review: no  Patient denies shortness of breath, fever, cough and chest pain at PAT appointment   All instructions explained to the patient, with a verbal understanding of the material. Patient agrees to go over the instructions while at home for a better understanding. Patient also instructed to self quarantine after being tested for COVID-19. The opportunity to ask questions was provided.

## 2023-12-08 ENCOUNTER — Observation Stay (HOSPITAL_COMMUNITY)
Admission: RE | Admit: 2023-12-08 | Discharge: 2023-12-09 | Disposition: A | Discharge status: Home / Self Care | Payer: Medicare Other | Source: Ambulatory Visit | Attending: Neurological Surgery | Admitting: Neurological Surgery

## 2023-12-08 ENCOUNTER — Ambulatory Visit (HOSPITAL_BASED_OUTPATIENT_CLINIC_OR_DEPARTMENT_OTHER): Payer: Medicare Other

## 2023-12-08 ENCOUNTER — Other Ambulatory Visit: Payer: Self-pay

## 2023-12-08 ENCOUNTER — Ambulatory Visit (HOSPITAL_COMMUNITY): Payer: Medicare Other

## 2023-12-08 ENCOUNTER — Encounter (HOSPITAL_COMMUNITY): Payer: Self-pay | Admitting: Neurological Surgery

## 2023-12-08 ENCOUNTER — Encounter (HOSPITAL_COMMUNITY)
Admission: RE | Disposition: A | Discharge status: Home / Self Care | Payer: Self-pay | Source: Ambulatory Visit | Attending: Neurological Surgery

## 2023-12-08 DIAGNOSIS — M5412 Radiculopathy, cervical region: Principal | ICD-10-CM | POA: Diagnosis present

## 2023-12-08 DIAGNOSIS — I10 Essential (primary) hypertension: Secondary | ICD-10-CM | POA: Diagnosis not present

## 2023-12-08 DIAGNOSIS — Z79899 Other long term (current) drug therapy: Secondary | ICD-10-CM | POA: Insufficient documentation

## 2023-12-08 DIAGNOSIS — M4722 Other spondylosis with radiculopathy, cervical region: Secondary | ICD-10-CM

## 2023-12-08 DIAGNOSIS — M4723 Other spondylosis with radiculopathy, cervicothoracic region: Secondary | ICD-10-CM | POA: Diagnosis not present

## 2023-12-08 DIAGNOSIS — M4803 Spinal stenosis, cervicothoracic region: Secondary | ICD-10-CM | POA: Insufficient documentation

## 2023-12-08 DIAGNOSIS — G5622 Lesion of ulnar nerve, left upper limb: Secondary | ICD-10-CM | POA: Diagnosis not present

## 2023-12-08 DIAGNOSIS — M4802 Spinal stenosis, cervical region: Secondary | ICD-10-CM

## 2023-12-08 DIAGNOSIS — Z87891 Personal history of nicotine dependence: Secondary | ICD-10-CM

## 2023-12-08 DIAGNOSIS — M50123 Cervical disc disorder at C6-C7 level with radiculopathy: Secondary | ICD-10-CM | POA: Diagnosis not present

## 2023-12-08 HISTORY — PX: ULNAR NERVE TRANSPOSITION: SHX2595

## 2023-12-08 HISTORY — PX: ANTERIOR CERVICAL DECOMP/DISCECTOMY FUSION: SHX1161

## 2023-12-08 LAB — ABO/RH: ABO/RH(D): A POS

## 2023-12-08 SURGERY — ANTERIOR CERVICAL DECOMPRESSION/DISCECTOMY FUSION 2 LEVELS
Anesthesia: General | Site: Neck

## 2023-12-08 MED ORDER — DEXAMETHASONE SODIUM PHOSPHATE 10 MG/ML IJ SOLN
INTRAMUSCULAR | Status: AC
  Filled 2023-12-08: qty 1

## 2023-12-08 MED ORDER — SODIUM CHLORIDE 0.9 % IV SOLN: INTRAVENOUS | Status: DC

## 2023-12-08 MED ORDER — ACETAMINOPHEN 650 MG RE SUPP: 650.0000 mg | RECTAL | Status: DC | PRN

## 2023-12-08 MED ORDER — LIDOCAINE-EPINEPHRINE 1 %-1:100000 IJ SOLN
INTRAMUSCULAR | Status: AC
  Filled 2023-12-08: qty 1

## 2023-12-08 MED ORDER — BACITRACIN ZINC 500 UNIT/GM EX OINT
TOPICAL_OINTMENT | CUTANEOUS | Status: AC
  Filled 2023-12-08: qty 28.35

## 2023-12-08 MED ORDER — DEXAMETHASONE SODIUM PHOSPHATE 10 MG/ML IJ SOLN
INTRAMUSCULAR | Status: DC | PRN
  Administered 2023-12-08: 10 mg via INTRAVENOUS

## 2023-12-08 MED ORDER — METHOCARBAMOL 500 MG PO TABS
500.0000 mg | ORAL_TABLET | Freq: Four times a day (QID) | ORAL | Status: DC | PRN
  Administered 2023-12-08: 500 mg via ORAL
  Filled 2023-12-08: qty 1

## 2023-12-08 MED ORDER — LIDOCAINE-EPINEPHRINE 1 %-1:100000 IJ SOLN
INTRAMUSCULAR | Status: DC | PRN
  Administered 2023-12-08: 7 mL

## 2023-12-08 MED ORDER — ORAL CARE MOUTH RINSE: 15.0000 mL | Freq: Once | OROMUCOSAL | Status: AC

## 2023-12-08 MED ORDER — THROMBIN 5000 UNITS EX SOLR
CUTANEOUS | Status: AC
  Filled 2023-12-08: qty 5000

## 2023-12-08 MED ORDER — SUGAMMADEX SODIUM 200 MG/2ML IV SOLN
INTRAVENOUS | Status: DC | PRN
  Administered 2023-12-08: 200 mg via INTRAVENOUS

## 2023-12-08 MED ORDER — SODIUM CHLORIDE 0.9% FLUSH: 3.0000 mL | INTRAVENOUS | Status: DC | PRN

## 2023-12-08 MED ORDER — MONTELUKAST SODIUM 10 MG PO TABS: 10.0000 mg | ORAL_TABLET | ORAL | Status: DC

## 2023-12-08 MED ORDER — HYDROMORPHONE HCL 1 MG/ML IJ SOLN: 0.5000 mg | INTRAMUSCULAR | Status: DC | PRN

## 2023-12-08 MED ORDER — MIDAZOLAM HCL 2 MG/2ML IJ SOLN
INTRAMUSCULAR | Status: DC | PRN
  Administered 2023-12-08: 2 mg via INTRAVENOUS

## 2023-12-08 MED ORDER — HYDROCODONE-ACETAMINOPHEN 5-325 MG PO TABS: 1.0000 | ORAL_TABLET | ORAL | Status: DC | PRN

## 2023-12-08 MED ORDER — ROCURONIUM BROMIDE 10 MG/ML (PF) SYRINGE
PREFILLED_SYRINGE | INTRAVENOUS | Status: AC
  Filled 2023-12-08: qty 10

## 2023-12-08 MED ORDER — LIDOCAINE 2% (20 MG/ML) 5 ML SYRINGE
INTRAMUSCULAR | Status: AC
  Filled 2023-12-08: qty 5

## 2023-12-08 MED ORDER — DROPERIDOL 2.5 MG/ML IJ SOLN
0.6250 mg | Freq: Once | INTRAMUSCULAR | Status: AC | PRN
  Administered 2023-12-08: 0.625 mg via INTRAVENOUS

## 2023-12-08 MED ORDER — ROCURONIUM BROMIDE 10 MG/ML (PF) SYRINGE
PREFILLED_SYRINGE | INTRAVENOUS | Status: DC | PRN
  Administered 2023-12-08: 30 mg via INTRAVENOUS
  Administered 2023-12-08: 70 mg via INTRAVENOUS

## 2023-12-08 MED ORDER — LIDOCAINE 2% (20 MG/ML) 5 ML SYRINGE
INTRAMUSCULAR | Status: DC | PRN
  Administered 2023-12-08: 100 mg via INTRAVENOUS

## 2023-12-08 MED ORDER — SODIUM CHLORIDE 0.9% FLUSH
3.0000 mL | Freq: Two times a day (BID) | INTRAVENOUS | Status: DC
Start: 2023-12-08 — End: 2023-12-09
  Administered 2023-12-08: 3 mL via INTRAVENOUS

## 2023-12-08 MED ORDER — METHOCARBAMOL 1000 MG/10ML IJ SOLN: 500.0000 mg | Freq: Four times a day (QID) | INTRAMUSCULAR | Status: DC | PRN

## 2023-12-08 MED ORDER — FENTANYL CITRATE (PF) 100 MCG/2ML IJ SOLN
INTRAMUSCULAR | Status: AC
  Filled 2023-12-08: qty 2

## 2023-12-08 MED ORDER — PHENOL 1.4 % MT LIQD
1.0000 | OROMUCOSAL | Status: DC | PRN
  Administered 2023-12-08: 1 via OROMUCOSAL
  Filled 2023-12-08: qty 177

## 2023-12-08 MED ORDER — GLYCOPYRROLATE 0.2 MG/ML IJ SOLN
INTRAMUSCULAR | Status: DC | PRN
  Administered 2023-12-08: .2 mg via INTRAVENOUS

## 2023-12-08 MED ORDER — THROMBIN 5000 UNITS EX SOLR: OROMUCOSAL | Status: DC | PRN

## 2023-12-08 MED ORDER — CEFAZOLIN SODIUM-DEXTROSE 2-4 GM/100ML-% IV SOLN
2.0000 g | Freq: Four times a day (QID) | INTRAVENOUS | Status: AC
  Administered 2023-12-08 – 2023-12-09 (×2): 2 g via INTRAVENOUS
  Filled 2023-12-08 (×2): qty 100

## 2023-12-08 MED ORDER — FENTANYL CITRATE (PF) 250 MCG/5ML IJ SOLN
INTRAMUSCULAR | Status: DC | PRN
  Administered 2023-12-08 (×2): 50 ug via INTRAVENOUS
  Administered 2023-12-08: 100 ug via INTRAVENOUS

## 2023-12-08 MED ORDER — OXYCODONE HCL 5 MG PO TABS
ORAL_TABLET | ORAL | Status: AC
  Filled 2023-12-08: qty 1

## 2023-12-08 MED ORDER — ONDANSETRON HCL 4 MG/2ML IJ SOLN
INTRAMUSCULAR | Status: AC
  Filled 2023-12-08: qty 2

## 2023-12-08 MED ORDER — DOCUSATE SODIUM 100 MG PO CAPS: 100.0000 mg | ORAL_CAPSULE | Freq: Two times a day (BID) | ORAL | Status: DC

## 2023-12-08 MED ORDER — CEFAZOLIN SODIUM-DEXTROSE 2-4 GM/100ML-% IV SOLN
INTRAVENOUS | Status: AC
  Filled 2023-12-08: qty 100

## 2023-12-08 MED ORDER — MIDAZOLAM HCL 2 MG/2ML IJ SOLN
INTRAMUSCULAR | Status: AC
  Filled 2023-12-08: qty 2

## 2023-12-08 MED ORDER — ONDANSETRON HCL 4 MG PO TABS: 4.0000 mg | ORAL_TABLET | Freq: Four times a day (QID) | ORAL | Status: DC | PRN

## 2023-12-08 MED ORDER — DROPERIDOL 2.5 MG/ML IJ SOLN
INTRAMUSCULAR | Status: AC
  Filled 2023-12-08: qty 2

## 2023-12-08 MED ORDER — OXYCODONE HCL 5 MG/5ML PO SOLN: 5.0000 mg | Freq: Once | ORAL | Status: AC | PRN

## 2023-12-08 MED ORDER — MENTHOL 3 MG MT LOZG
1.0000 | LOZENGE | OROMUCOSAL | Status: DC | PRN
  Filled 2023-12-08: qty 9

## 2023-12-08 MED ORDER — LACTATED RINGERS IV SOLN: INTRAVENOUS | Status: DC | PRN

## 2023-12-08 MED ORDER — ONDANSETRON HCL 4 MG/2ML IJ SOLN
INTRAMUSCULAR | Status: DC | PRN
  Administered 2023-12-08: 4 mg via INTRAVENOUS

## 2023-12-08 MED ORDER — HYDROCODONE-ACETAMINOPHEN 5-325 MG PO TABS
2.0000 | ORAL_TABLET | ORAL | Status: DC | PRN
  Administered 2023-12-08: 2 via ORAL
  Filled 2023-12-08: qty 2

## 2023-12-08 MED ORDER — POLYETHYLENE GLYCOL 3350 17 G PO PACK: 17.0000 g | PACK | Freq: Every day | ORAL | Status: DC | PRN

## 2023-12-08 MED ORDER — FENTANYL CITRATE (PF) 250 MCG/5ML IJ SOLN
INTRAMUSCULAR | Status: AC
  Filled 2023-12-08: qty 5

## 2023-12-08 MED ORDER — BUPIVACAINE-EPINEPHRINE (PF) 0.25% -1:200000 IJ SOLN
INTRAMUSCULAR | Status: AC
  Filled 2023-12-08: qty 30

## 2023-12-08 MED ORDER — KETOROLAC TROMETHAMINE 15 MG/ML IJ SOLN
7.5000 mg | Freq: Four times a day (QID) | INTRAMUSCULAR | Status: DC
  Administered 2023-12-08 – 2023-12-09 (×3): 7.5 mg via INTRAVENOUS
  Filled 2023-12-08 (×3): qty 1

## 2023-12-08 MED ORDER — CEFAZOLIN SODIUM-DEXTROSE 2-4 GM/100ML-% IV SOLN
2.0000 g | INTRAVENOUS | Status: AC
  Administered 2023-12-08: 2 g via INTRAVENOUS

## 2023-12-08 MED ORDER — FLEET ENEMA RE ENEM
1.0000 | ENEMA | Freq: Once | RECTAL | Status: DC | PRN
Start: 2023-12-08 — End: 2023-12-09

## 2023-12-08 MED ORDER — EPHEDRINE SULFATE-NACL 50-0.9 MG/10ML-% IV SOSY
PREFILLED_SYRINGE | INTRAVENOUS | Status: DC | PRN
  Administered 2023-12-08 (×2): 10 mg via INTRAVENOUS

## 2023-12-08 MED ORDER — 0.9 % SODIUM CHLORIDE (POUR BTL) OPTIME
TOPICAL | Status: DC | PRN
  Administered 2023-12-08: 1000 mL

## 2023-12-08 MED ORDER — ACETAMINOPHEN 10 MG/ML IV SOLN: 1000.0000 mg | Freq: Once | INTRAVENOUS | Status: DC | PRN

## 2023-12-08 MED ORDER — PROPOFOL 10 MG/ML IV BOLUS
INTRAVENOUS | Status: DC | PRN
  Administered 2023-12-08: 150 mg via INTRAVENOUS

## 2023-12-08 MED ORDER — PANTOPRAZOLE SODIUM 40 MG PO TBEC: 40.0000 mg | DELAYED_RELEASE_TABLET | Freq: Every day | ORAL | Status: DC

## 2023-12-08 MED ORDER — OXYCODONE HCL 5 MG PO TABS
5.0000 mg | ORAL_TABLET | Freq: Once | ORAL | Status: AC | PRN
  Administered 2023-12-08: 5 mg via ORAL

## 2023-12-08 MED ORDER — CHLORHEXIDINE GLUCONATE CLOTH 2 % EX PADS: 6.0000 | MEDICATED_PAD | Freq: Once | CUTANEOUS | Status: DC

## 2023-12-08 MED ORDER — CHLORHEXIDINE GLUCONATE 0.12 % MT SOLN
15.0000 mL | Freq: Once | OROMUCOSAL | Status: AC
  Administered 2023-12-08: 15 mL via OROMUCOSAL
  Filled 2023-12-08: qty 15

## 2023-12-08 MED ORDER — CARBOXYMETHYLCELLULOSE SODIUM 0.5 % OP SOLN: 1.0000 [drp] | Freq: Three times a day (TID) | OPHTHALMIC | Status: DC | PRN

## 2023-12-08 MED ORDER — DOXAZOSIN MESYLATE 8 MG PO TABS
8.0000 mg | ORAL_TABLET | Freq: Every day | ORAL | Status: DC
  Administered 2023-12-08: 8 mg via ORAL
  Filled 2023-12-08 (×3): qty 1

## 2023-12-08 MED ORDER — PHENYLEPHRINE HCL-NACL 20-0.9 MG/250ML-% IV SOLN
INTRAVENOUS | Status: DC | PRN
  Administered 2023-12-08: 25 ug/min via INTRAVENOUS

## 2023-12-08 MED ORDER — FENTANYL CITRATE (PF) 100 MCG/2ML IJ SOLN
25.0000 ug | INTRAMUSCULAR | Status: DC | PRN
  Administered 2023-12-08: 50 ug via INTRAVENOUS

## 2023-12-08 MED ORDER — POLYVINYL ALCOHOL 1.4 % OP SOLN
1.0000 [drp] | OPHTHALMIC | Status: DC | PRN
  Administered 2023-12-08: 1 [drp] via OPHTHALMIC
  Filled 2023-12-08: qty 15

## 2023-12-08 MED ORDER — PROPOFOL 10 MG/ML IV BOLUS
INTRAVENOUS | Status: AC
Start: 2023-12-08 — End: ?
  Filled 2023-12-08: qty 20

## 2023-12-08 MED ORDER — ONDANSETRON HCL 4 MG/2ML IJ SOLN: 4.0000 mg | Freq: Four times a day (QID) | INTRAMUSCULAR | Status: DC | PRN

## 2023-12-08 MED ORDER — ACETAMINOPHEN 325 MG PO TABS: 650.0000 mg | ORAL_TABLET | ORAL | Status: DC | PRN

## 2023-12-08 SURGICAL SUPPLY — 68 items
BAG COUNTER SPONGE SURGICOUNT (BAG) ×2 IMPLANT
BENZOIN TINCTURE PRP APPL 2/3 (GAUZE/BANDAGES/DRESSINGS) ×2 IMPLANT
BIT DRILL NEURO 2X3.1 SFT TUCH (MISCELLANEOUS) ×2 IMPLANT
BLADE CLIPPER SURG (BLADE) IMPLANT
BLADE SURG 15 STRL LF DISP TIS (BLADE) ×4 IMPLANT
BNDG ELASTIC 3INX 5YD STR LF (GAUZE/BANDAGES/DRESSINGS) ×2 IMPLANT
BUR CARBIDE MATCH 3.0 (BURR) ×2 IMPLANT
CABLE BIPOLOR RESECTION CORD (MISCELLANEOUS) ×2 IMPLANT
CANISTER SUCT 3000ML PPV (MISCELLANEOUS) ×2 IMPLANT
CNTNR URN SCR LID CUP LEK RST (MISCELLANEOUS) ×2 IMPLANT
COVER MAYO STAND STRL (DRAPES) ×2 IMPLANT
DRAPE C-ARM 42X72 X-RAY (DRAPES) ×2 IMPLANT
DRAPE HALF SHEET 40X57 (DRAPES) ×2 IMPLANT
DRAPE INCISE IOBAN 66X45 STRL (DRAPES) ×2 IMPLANT
DRAPE LAPAROTOMY 100X72 PEDS (DRAPES) ×2 IMPLANT
DRAPE LAPAROTOMY 100X72X124 (DRAPES) ×2 IMPLANT
DRAPE MICROSCOPE SLANT 54X150 (MISCELLANEOUS) ×4 IMPLANT
DRILL NEURO 2X3.1 SOFT TOUCH (MISCELLANEOUS) ×2
DRSG OPSITE POSTOP 4X6 (GAUZE/BANDAGES/DRESSINGS) IMPLANT
DURAPREP 6ML APPLICATOR 50/CS (WOUND CARE) ×2 IMPLANT
ELECT COATED BLADE 2.86 ST (ELECTRODE) ×4 IMPLANT
ELECT REM PT RETURN 9FT ADLT (ELECTROSURGICAL) ×2
ELECTRODE REM PT RTRN 9FT ADLT (ELECTROSURGICAL) ×2 IMPLANT
EVACUATOR 1/8 PVC DRAIN (DRAIN) IMPLANT
GAUZE 4X4 16PLY ~~LOC~~+RFID DBL (SPONGE) ×2 IMPLANT
GAUZE SPONGE 4X4 12PLY STRL (GAUZE/BANDAGES/DRESSINGS) ×2 IMPLANT
GLOVE BIO SURGEON STRL SZ7 (GLOVE) ×4 IMPLANT
GLOVE BIOGEL PI IND STRL 7.5 (GLOVE) ×4 IMPLANT
GLOVE BIOGEL PI IND STRL 8 (GLOVE) ×2 IMPLANT
GLOVE ECLIPSE 7.5 STRL STRAW (GLOVE) ×4 IMPLANT
GLOVE ECLIPSE 8.0 STRL XLNG CF (GLOVE) ×6 IMPLANT
GLOVE EXAM NITRILE LRG STRL (GLOVE) IMPLANT
GLOVE EXAM NITRILE XL STR (GLOVE) IMPLANT
GLOVE EXAM NITRILE XS STR PU (GLOVE) IMPLANT
GLOVE SRG 8 PF TXTR STRL LF DI (GLOVE) ×2 IMPLANT
GOWN STRL REUS W/ TWL LRG LVL3 (GOWN DISPOSABLE) IMPLANT
GOWN STRL REUS W/ TWL XL LVL3 (GOWN DISPOSABLE) ×8 IMPLANT
GOWN STRL REUS W/TWL 2XL LVL3 (GOWN DISPOSABLE) IMPLANT
HEMOSTAT POWDER KIT SURGIFOAM (HEMOSTASIS) ×2 IMPLANT
KIT BASIN OR (CUSTOM PROCEDURE TRAY) ×4 IMPLANT
KIT TURNOVER KIT B (KITS) ×4 IMPLANT
MARKER SKIN DUAL TIP RULER LAB (MISCELLANEOUS) ×2 IMPLANT
NDL HYPO 25X1 1.5 SAFETY (NEEDLE) ×2 IMPLANT
NDL SPNL 18GX3.5 QUINCKE PK (NEEDLE) ×2 IMPLANT
NEEDLE HYPO 25X1 1.5 SAFETY (NEEDLE) ×2 IMPLANT
NEEDLE SPNL 18GX3.5 QUINCKE PK (NEEDLE) ×2 IMPLANT
NS IRRIG 1000ML POUR BTL (IV SOLUTION) ×4 IMPLANT
PACK LAMINECTOMY NEURO (CUSTOM PROCEDURE TRAY) ×2 IMPLANT
PACK SRG BSC III STRL LF ECLPS (CUSTOM PROCEDURE TRAY) ×2 IMPLANT
PAD ARMBOARD 7.5X6 YLW CONV (MISCELLANEOUS) ×6 IMPLANT
PIN DISTRACTION 14MM (PIN) ×4 IMPLANT
PLATE 39MM (Plate) IMPLANT
SCREW 3.5 SELFDRILL 15MM VARI (Screw) IMPLANT
SPACER BONE CORNERSTONE 5X14 (Orthopedic Implant) IMPLANT
SPIKE FLUID TRANSFER (MISCELLANEOUS) ×2 IMPLANT
SPONGE INTESTINAL PEANUT (DISPOSABLE) ×2 IMPLANT
SPONGE SURGIFOAM ABS GEL SZ50 (HEMOSTASIS) ×2 IMPLANT
STAPLER VISISTAT 35W (STAPLE) IMPLANT
STRIP CLOSURE SKIN 1/2X4 (GAUZE/BANDAGES/DRESSINGS) ×4 IMPLANT
SUT ETHILON 3 0 PS 1 (SUTURE) ×2 IMPLANT
SUT VIC AB 2-0 CP2 18 (SUTURE) IMPLANT
SYR BULB EAR ULCER 3OZ GRN STR (SYRINGE) ×2 IMPLANT
SYR CONTROL 10ML LL (SYRINGE) ×2 IMPLANT
TAPE SURG TRANSPORE 1 IN (GAUZE/BANDAGES/DRESSINGS) ×2 IMPLANT
TOWEL GREEN STERILE (TOWEL DISPOSABLE) ×4 IMPLANT
TOWEL GREEN STERILE FF (TOWEL DISPOSABLE) ×4 IMPLANT
TUBE CONNECTING 12X1/4 (SUCTIONS) ×2 IMPLANT
WATER STERILE IRR 1000ML POUR (IV SOLUTION) ×4 IMPLANT

## 2023-12-08 NOTE — Plan of Care (Signed)

## 2023-12-08 NOTE — Progress Notes (Signed)
 Orthopedic Tech Progress Note Patient Details:  Eugene Mcdaniel 1951-05-14 [id number] Aspen collar delivered to [address]  Patient ID: Eugene Mcdaniel, male   DOB: 1951-08-02, 73 y.o.   MRN: [id number]  Eugene Mcdaniel 12/08/2023, 3:04 PM

## 2023-12-08 NOTE — Op Note (Signed)
 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 Arthrodesis C6-7, C7-T1, anterior interbody technique  2. Placement of intervertebral biomechanical device C6-7, C7-T1: Cortical allograft, 5 mm at both levels 3. Placement of anterior instrumentation consisting of interbody plate and screws -Medtronic Zevo 39 mm plate, C6 bilateral 15 mm screws, C7 bilateral 15 mm screws, T1 bilateral 15 mm screws 4. Discectomy at C6-7, C7-T1 for decompression of spinal cord and exiting nerve roots  5. Use of intraoperative microscope 6.  Use of autograft, same incision 7.  Left ulnar nerve decompression  SURGEON: Dr. Lani Meadows, DO  ASSISTANT: Camie Pickle, PA  ANESTHESIA: General Endotracheal  EBL: 25 cc  SPECIMENS: None  DRAINS: None  COMPLICATIONS: None immediate  CONDITION: Hemodynamically stable to PACU  HISTORY: Eugene Mcdaniel is a 73 y.o. y.o. male who initially presented to the outpatient clinic with signs and symptoms consistent with C7 and C8 radiculopathy and ulnar neuropathy. MRI demonstrated severe foraminal stenosis at C6-7 and C7-T1.  EMG revealed ulnar neuropathy at the elbow.  His symptomatology was resistant to conservative measures and therefore I offered him surgical intervention in the form of an ACDF C6/T1 and left ulnar nerve decompression. Treatment options were discussed including epidural injections, pain control and physical therapy.  All risks, benefits and expected outcomes were discussed and agreed upon.. After all questions were answered, informed consent was obtained.  PROCEDURE IN DETAIL: The patient was brought to the operating room and transferred to the operative table. After induction of general anesthesia, the patient was positioned on the operative table  in the supine position with all pressure points meticulously padded. The skin of the neck was then prepped and draped in the usual sterile fashion.  Physician driven timeout was performed.  After timeout was conducted, skin incision was then made sharply with a 10 blade and Bovie electrocautery was used to dissect the subcutaneous tissue until the platysma was identified. The platysma was then divided and undermined. The sternocleidomastoid muscle was then identified and, utilizing natural fascial planes in the neck, the prevertebral fascia was identified and the carotid sheath was retracted laterally and the trachea and esophagus retracted medially. Again using fluoroscopy, the correct disc space was identified. Bovie electrocautery was used to dissect in the subperiosteal plane and elevate the bilateral longus coli muscles. Self-retaining retractors were then placed under the longus coli muscles bilaterally. At this point, the microscope was draped and brought into the field, and the remainder of the case was done under the microscope using microdissecting technique.  ACDF C6-7: Distraction pins were placed in midline above and below the disc space.  The disc  space was placed in distraction.  The disc space was incised sharply and rongeurs were use to initially complete a discectomy. The high-speed drill was then used to complete discectomy until the posterior annulus was identified and removed and the posterior longitudinal ligament was identified. Using microcurettes, the PLL was elevated, and Kerrison rongeurs were used to remove the posterior longitudinal ligament and the ventral thecal sac was identified. Using a combination of curettes and ronguers, complete decompression of the thecal sac and exiting nerve roots at this level was completed, and verified using micro-nerve hook.  There is a large osteophyte that was removed from the left proximal foraminal zone.  The disc space was taken out of  distraction.  Epidural hemostasis was achieved with Surgifoam.  Having completed our decompression, attention was turned to placement of the intervertebral device. Trial spacers were used to select a 5mm graft. This graft was then filled with autograft and morcellized allograft, and inserted under live fluoroscopy.  ACDF C7-T1: Distraction pins were placed in midline above and below the disc space.  The disc  space was placed in distraction.  The disc space was incised sharply and rongeurs were use to initially complete a discectomy. The high-speed drill was then used to complete discectomy until the posterior annulus was identified and removed and the posterior longitudinal ligament was identified. Using microcurettes, the PLL was elevated, and Kerrison rongeurs were used to remove the posterior longitudinal ligament and the ventral thecal sac was identified. Using a combination of curettes and ronguers, complete decompression of the thecal sac and exiting nerve roots at this level was completed, and verified using micro-nerve hook. The disc space was taken out of distraction.  Epidural hemostasis was achieved with Surgifoam.  Having completed our decompression, attention was turned to placement of the intervertebral device. Trial spacers were used to select a 5mm graft. This graft was then filled with autograft and morcellized allograft, and inserted under live fluoroscopy.  After placement of the intervertebral device, the above anterior cervical plate was selected, and placed across the interspaces. Using a high-speed drill, the cortex of the cervical vertebral bodies was punctured, and screws inserted in C6, C7 and T1 bilaterally with appropriate bony purchase. Final fluoroscopic images in AP and lateral projections were taken to confirm good hardware placement.  The plate was final tightened to the manufacturer's recommendation and the screws were locked in place.  At this point, after all counts  were verified to be correct, meticulous hemostasis was secured using a combination of bipolar electrocautery and passive hemostatics.  Skin was closed with staples.  Sterile dressing was applied.  The back table was remained sterile.  The patient's left elbow was then prepped and draped in standard fashion.  Repeat timeout was performed.  Local anesthetic was injected into the planned incision.  Incision was made with 15 blade sharply just lateral to the medial epicondyle of the elbow.  Using Metzenbaum scissors, blunt dissection was performed down to the fascia of the flexor carpi ulnaris.  This was followed proximally and tight fascial bands were cleared from the ulnar nerve which was identified and protected.  This was then circumferentially decompressed and the cubital tunnel.  This was followed distally into the flexor carpi all naris and freed from fascial banding.  This was then followed proximally to its exit along the triceps and cleared for fascial banding.  Using a Ceola, I felt proximally and distally along the path of the ulnar nerve and it appeared decompressed.  Hemostasis was achieved with bipolar forceps.  The wound was then closed in layers with 2-0 Vicryl sutures.  Skin was closed with staples.  Sterile dressing applied.  The patient tolerated the procedure well and was extubated in the room and taken to the postanesthesia care unit in stable condition.

## 2023-12-08 NOTE — Transfer of Care (Signed)
 Immediate Anesthesia Transfer of Care Note  Patient: Eugene Mcdaniel  Procedure(s) Performed: CERVICAL SIX-SEVEN, CERVICAL SEVEN-THORACIC ONE ANTERIOR CERIVCAL DECOMPRESSION/DISCECTOMY FUSION (Neck) ULNAR NERVE DECOMPRESSION/TRANSPOSITION (Left: Arm Lower)  Patient Location: PACU  Anesthesia Type:General  Level of Consciousness: drowsy  Airway & Oxygen  Therapy: Patient Spontanous Breathing and Patient connected to face mask oxygen   Post-op Assessment: Report given to RN and Post -op Vital signs reviewed and stable  Post vital signs: Reviewed and stable  Last Vitals:  Vitals Value Taken Time  BP 130/66 12/08/23 1437  Temp    Pulse 71 12/08/23 1438  Resp 18 12/08/23 1438  SpO2 97 % 12/08/23 1438  Vitals shown include unfiled device data.  Last Pain:  Vitals:   12/08/23 0941  TempSrc:   PainSc: 0-No pain         Complications: No notable events documented.

## 2023-12-08 NOTE — Anesthesia Postprocedure Evaluation (Signed)
 Anesthesia Post Note  Patient: Eugene Mcdaniel  Procedure(s) Performed: CERVICAL SIX-SEVEN, CERVICAL SEVEN-THORACIC ONE ANTERIOR CERIVCAL DECOMPRESSION/DISCECTOMY FUSION (Neck) ULNAR NERVE DECOMPRESSION/TRANSPOSITION (Left: Arm Lower)     Patient location during evaluation: PACU Anesthesia Type: General Level of consciousness: awake and alert Pain management: pain level controlled Vital Signs Assessment: post-procedure vital signs reviewed and stable Respiratory status: spontaneous breathing, nonlabored ventilation, respiratory function stable and patient connected to nasal cannula oxygen  Cardiovascular status: blood pressure returned to baseline and stable Postop Assessment: no apparent nausea or vomiting Anesthetic complications: no   No notable events documented.  Last Vitals:  Vitals:   12/08/23 1526 12/08/23 1549  BP: (!) 144/97 (!) 155/82  Pulse: 63 73  Resp: 11 18  Temp: 36.5 C   SpO2: 91% 96%    Last Pain:  Vitals:   12/08/23 1635  TempSrc:   PainSc: 7                  Thom JONELLE Peoples

## 2023-12-08 NOTE — Anesthesia Procedure Notes (Signed)
 Procedure Name: Intubation Date/Time: 12/08/2023 11:08 AM  Performed by: Elby Raelene SAUNDERS, CRNAPre-anesthesia Checklist: Patient identified, Emergency Drugs available, Suction available and Patient being monitored Patient Re-evaluated:Patient Re-evaluated prior to induction Oxygen  Delivery Method: Circle System Utilized Preoxygenation: Pre-oxygenation with 100% oxygen  Induction Type: IV induction Ventilation: Mask ventilation without difficulty Laryngoscope Size: Glidescope and 4 Grade View: Grade II Tube type: Oral Tube size: 7.5 mm Number of attempts: 1 Airway Equipment and Method: Stylet and Oral airway Placement Confirmation: ETT inserted through vocal cords under direct vision, positive ETCO2 and breath sounds checked- equal and bilateral Secured at: 24 cm Tube secured with: Tape Dental Injury: Teeth and Oropharynx as per pre-operative assessment

## 2023-12-08 NOTE — H&P (Signed)
 Providing Compassionate, Quality Care - Together  NEUROSURGERY HISTORY & PHYSICAL   Eugene Mcdaniel is an 73 y.o. male.   Chief Complaint: Left upper extremity radiculopathy HPI: This is a 73 year old male with progressively worsening complaints of left upper extremity radicular pain for greater than 1 year.  He also has neck pain has been progressing as well and tingling down his arm into his last 2 digits.  He has had some progressive weakness.  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  Past Medical History:  Diagnosis Date   Allergy    Colon polyp    GERD (gastroesophageal reflux disease)    Hypercholesterolemia    Hyperplastic colon polyp 12/28/2017   Colonoscopy 2018, Dr. Shana, repeat 10 years.   Primary osteoarthritis involving multiple joints    Seasonal allergic rhinitis due to pollen    Sleep apnea     Past Surgical History:  Procedure Laterality Date   APPENDECTOMY     colonoscopy   06/2005   MEDIAL COLLATERAL LIGAMENT REPAIR, KNEE Right    football injury   TONSILLECTOMY AND ADENOIDECTOMY  1962    Family History  Problem Relation Age of Onset   Lung cancer Mother    Arthritis Father    Heart disease Father    Healthy Daughter    Healthy Sister    Diabetes Neg Hx    Social History:  reports that he quit smoking about 37 years ago. His smoking use included cigarettes. He started smoking about 67 years ago. He has a 60 pack-year smoking history. He has quit using smokeless tobacco. He reports current alcohol  use. He reports that he does not use drugs.  Allergies:  Allergies  Allergen Reactions   Atorvastatin Other (See Comments)    myalgias   Simvastatin Other (See Comments)    myalgias    Medications Prior to Admission  Medication Sig Dispense Refill   ascorbic  acid (VITAMIN C) 500 MG tablet Take 500 mg by mouth daily.     carboxymethylcellulose (REFRESH PLUS) 0.5 % SOLN Place 1 drop into both eyes 3 (three) times daily as needed (dry eyes).     doxazosin  (CARDURA ) 8 MG tablet TAKE 1 TABLET BY MOUTH  DAILY 90 tablet 3   fluticasone  (FLONASE ) 50 MCG/ACT nasal spray Place 1 spray into both nostrils daily. 48 g 3   montelukast  (SINGULAIR ) 10 MG tablet Take 1 tablet (10 mg total) by mouth at bedtime. (Patient taking differently: Take 10 mg by mouth every other day.) 90 tablet 3   Multiple Vitamins-Minerals (MENS MULTIVITAMIN) TABS Take 1 tablet by mouth daily.     NON FORMULARY Pt uses a c-pap nightly     Omega-3 Fatty Acids (FISH OIL) 1000 MG CPDR Take 1,000 mg by mouth daily.     omeprazole  (PRILOSEC ) 20 MG capsule TAKE 1 CAPSULE DAILY 30 capsule 3   oxymetazoline (AFRIN) 0.05 % nasal spray Place 1 spray into both nostrils daily as needed for congestion.     Digestive Enzymes (BETAINE HCL) 650-130 MG CAPS Take by mouth. (Patient not taking: Reported on 12/02/2023)     levocetirizine (XYZAL ) 5 MG tablet Take 1 tablet (5 mg total) by mouth every evening. (Patient not taking: Reported on 12/02/2023) 100 tablet 0    No results found for this or any previous  visit (from the past 48 hours). No results found.  ROS All pertinent positives and negatives are listed HPI above  Blood pressure (!) 136/91, pulse 63, temperature 97.9 F (36.6 C), temperature source Oral, resp. rate 18, height 5' 10 (1.778 m), weight 83.9 kg, SpO2 96%. Physical Exam  Awake alert oriented x 3, no acute distress PERRLA Cranial nerves II through XII intact Full strength in upper extremities except for left grip and finger extensor 4+/5 Decreased sensation in the C7 and C8 distribution on the left Full strength in lower extremities Positive Tinel's over the left elbow   Assessment/Plan 73 year old male with  C6/T1 cervical stenosis, foraminal stenosis with left  radiculopathy Left ulnar neuropathy, entrapment at the elbow  -OR today for C6-T1 ACDF, left ulnar nerve decompression.  We discussed all risks, benefits and expected outcomes as well as alternatives to treatment.  Informed consent was obtained and witnessed.  Thank you for allowing me to participate in this patient's care.  Please do not hesitate to call with questions or concerns.   Lani Meadows, DO Neurosurgeon [facility] [phone]

## 2023-12-08 NOTE — Anesthesia Preprocedure Evaluation (Signed)
 Anesthesia Evaluation  Patient identified by MRN, date of birth, ID band Patient awake    Reviewed: Allergy & Precautions, H&P , NPO status , Patient's Chart, lab work & pertinent test results  Airway Mallampati: II  TM Distance: >3 FB Neck ROM: Full    Dental no notable dental hx.    Pulmonary sleep apnea , former smoker   Pulmonary exam normal breath sounds clear to auscultation       Cardiovascular hypertension, Normal cardiovascular exam Rhythm:Regular Rate:Normal     Neuro/Psych Cervical radiculopathy  negative neurological ROS  negative psych ROS   GI/Hepatic Neg liver ROS,GERD  ,,  Endo/Other  negative endocrine ROS    Renal/GU negative Renal ROS  negative genitourinary   Musculoskeletal  (+) Arthritis ,    Abdominal   Peds negative pediatric ROS (+)  Hematology negative hematology ROS (+)   Anesthesia Other Findings   Reproductive/Obstetrics negative OB ROS                              Anesthesia Physical Anesthesia Plan  ASA: 3  Anesthesia Plan: General   Post-op Pain Management:    Induction: Intravenous  PONV Risk Score and Plan: 2 and Ondansetron , Dexamethasone  and Treatment may vary due to age or medical condition  Airway Management Planned: Oral ETT  Additional Equipment:   Intra-op Plan:   Post-operative Plan: Extubation in OR  Informed Consent: I have reviewed the patients History and Physical, chart, labs and discussed the procedure including the risks, benefits and alternatives for the proposed anesthesia with the patient or authorized representative who has indicated his/her understanding and acceptance.     Dental advisory given  Plan Discussed with: CRNA  Anesthesia Plan Comments:          Anesthesia Quick Evaluation

## 2023-12-09 ENCOUNTER — Encounter (HOSPITAL_COMMUNITY): Payer: Self-pay | Admitting: Neurological Surgery

## 2023-12-09 DIAGNOSIS — M4803 Spinal stenosis, cervicothoracic region: Secondary | ICD-10-CM | POA: Diagnosis not present

## 2023-12-09 DIAGNOSIS — Z79899 Other long term (current) drug therapy: Secondary | ICD-10-CM | POA: Diagnosis not present

## 2023-12-09 DIAGNOSIS — M4723 Other spondylosis with radiculopathy, cervicothoracic region: Secondary | ICD-10-CM | POA: Diagnosis not present

## 2023-12-09 DIAGNOSIS — Z87891 Personal history of nicotine dependence: Secondary | ICD-10-CM | POA: Diagnosis not present

## 2023-12-09 DIAGNOSIS — G5622 Lesion of ulnar nerve, left upper limb: Secondary | ICD-10-CM | POA: Diagnosis not present

## 2023-12-09 MED ORDER — METHOCARBAMOL 500 MG PO TABS: 500.0000 mg | ORAL_TABLET | Freq: Four times a day (QID) | ORAL | 0 refills | Status: DC | PRN

## 2023-12-09 MED ORDER — HYDROCODONE-ACETAMINOPHEN 5-325 MG PO TABS: 2.0000 | ORAL_TABLET | Freq: Four times a day (QID) | ORAL | 0 refills | Status: DC | PRN

## 2023-12-09 MED ORDER — DOCUSATE SODIUM 100 MG PO CAPS: 100.0000 mg | ORAL_CAPSULE | Freq: Two times a day (BID) | ORAL | 0 refills | Status: DC

## 2023-12-09 NOTE — Evaluation (Signed)
 Physical Therapy Evaluation Patient Details Name: Eugene Mcdaniel MRN: [id number] DOB: [birth year] Today's Date: 12/09/2023  History of Present Illness  The pt is a 73 yo male presenting 1/9 for C6-7 and C7-T1 ACDF and L ulnar decompression due to neck and L arm pain. PMH includes: arthritis, sleep apnea.   Clinical Impression  Pt in bed upon arrival of PT, agreeable to evaluation at this time. Prior to admission the pt was completely independent without DME, reports no falls but occasional dizziness with turning due to new glasses. The pt was educated on log roll technique towards R side to maintain cervical precautions and limit wt bearing through L elbow. The pt was able to complete bed mobility, sit-stand transfers, and hallway ambulation with supervision without need for UE support or DME. He did use a single rail in RUE to complete flight of stairs, but did not need external assist. Pt educated on cervical precautions, progressive exercise recommendations, and neither he nor his family have additional questions or concerns. Pt is safe to return home with family support and no new DME. No further acute PT needs identified at this time, thank you for the consult.        If plan is discharge home, recommend the following: Help with stairs or ramp for entrance;Assist for transportation   Can travel by private vehicle    yes    Equipment Recommendations None recommended by PT  Recommendations for Other Services       Functional Status Assessment Patient has had a recent decline in their functional status and demonstrates the ability to make significant improvements in function in a reasonable and predictable amount of time.     Precautions / Restrictions Precautions Precautions: Cervical Precaution Booklet Issued: Yes (comment) Required Braces or Orthoses: Cervical Brace Cervical Brace: Hard collar;At all times Restrictions Weight Bearing Restrictions Per Provider Order: Yes LUE  Weight Bearing Per Provider Order: Non weight bearing Other Position/Activity Restrictions: non weight bearing through L elbow      Mobility  Bed Mobility Overal bed mobility: Needs Assistance Bed Mobility: Rolling, Sidelying to Sit Rolling: Supervision Sidelying to sit: Supervision       General bed mobility comments: supervision with cues, rolling to R to maintain NWB L elbow    Transfers Overall transfer level: Needs assistance Equipment used: None Transfers: Sit to/from Stand Sit to Stand: Supervision           General transfer comment: supervision in session for safety, no DME    Ambulation/Gait Ambulation/Gait assistance: Supervision, Contact guard assist Gait Distance (Feet): 300 Feet Assistive device: None Gait Pattern/deviations: WFL(Within Functional Limits) Gait velocity: WFL     General Gait Details: from CGA to supervision, no overt LOB or need for assist. pt reports issue with glasses that makes him dizzy with turning at baseline  Stairs Stairs: Yes Stairs assistance: Supervision Stair Management: Alternating pattern, One rail Right, Forwards Number of Stairs: 10      Balance Overall balance assessment: Mild deficits observed, not formally tested                                           Pertinent Vitals/Pain Pain Assessment Pain Assessment: Faces Faces Pain Scale: Hurts a little bit Pain Location: incision, L elbow Pain Descriptors / Indicators: Discomfort Pain Intervention(s): Limited activity within patient's tolerance, Monitored during session    Home Living  Family/patient expects to be discharged to:: Private residence Living Arrangements: Spouse/significant other Available Help at Discharge: Family Type of Home: House Home Access: Stairs to enter Entrance Stairs-Rails: Right;Left;Can reach both Entrance Stairs-Number of Steps: 2 Alternate Level Stairs-Number of Steps: flight Home Layout: Able to live on main  level with bedroom/bathroom;Multi-level Home Equipment: Grab bars - tub/shower;Shower seat;Hand held shower head      Prior Function Prior Level of Function : Independent/Modified Independent;Working/employed;Driving             Mobility Comments: independent, works from home ADLs Comments: independent     Extremity/Trunk Assessment   Upper Extremity Assessment Upper Extremity Assessment: Defer to OT evaluation    Lower Extremity Assessment Lower Extremity Assessment: Overall WFL for tasks assessed    Cervical / Trunk Assessment Cervical / Trunk Assessment: Neck Surgery  Communication   Communication Communication: No apparent difficulties Cueing Techniques: Verbal cues  Cognition Arousal: Alert Behavior During Therapy: WFL for tasks assessed/performed Overall Cognitive Status: Within Functional Limits for tasks assessed                                          General Comments General comments (skin integrity, edema, etc.): VSS on RA    Exercises     Assessment/Plan    PT Assessment Patient does not need any further PT services         PT Goals (Current goals can be found in the Care Plan section)  Acute Rehab PT Goals Patient Stated Goal: return home PT Goal Formulation: All assessment and education complete, DC therapy Time For Goal Achievement: 12/23/23 Potential to Achieve Goals: Good     AM-PAC PT 6 Clicks Mobility  Outcome Measure Help needed turning from your back to your side while in a flat bed without using bedrails?: A Little Help needed moving from lying on your back to sitting on the side of a flat bed without using bedrails?: A Little Help needed moving to and from a bed to a chair (including a wheelchair)?: A Little Help needed standing up from a chair using your arms (e.g., wheelchair or bedside chair)?: A Little Help needed to walk in hospital room?: A Little Help needed climbing 3-5 steps with a railing? : A  Little 6 Click Score: 18    End of Session Equipment Utilized During Treatment: Gait belt;Cervical collar Activity Tolerance: Patient tolerated treatment well Patient left: in bed;with call bell/phone within reach;with family/visitor present Nurse Communication: Mobility status PT Visit Diagnosis: Pain Pain - part of body:  (neck)    Time: 9084-9061 PT Time Calculation (min) (ACUTE ONLY): 23 min   Charges:   PT Evaluation $PT Eval Low Complexity: 1 Low   PT General Charges $$ ACUTE PT VISIT: 1 Visit         Izetta Call, PT, DPT   Acute Rehabilitation Department Office [phone] Secure Chat Communication Preferred  Izetta JULIANNA Call 12/09/2023, 10:21 AM

## 2023-12-09 NOTE — Evaluation (Signed)
 Occupational Therapy Evaluation Patient Details Name: Eugene Mcdaniel MRN: [id number] DOB: [birth year] Today's Date: 12/09/2023   History of Present Illness The pt is a 73 yo male presenting 1/9 for C6-7 and C7-T1 ACDF and L ulnar decompression due to neck and L arm pain. PMH includes: arthritis, sleep apnea.   Clinical Impression   Patient admitted for the diagnosis above.  PTA he lives at home with his spouse, who can assist as needed.  Patient is very close to his baseline needing no assist for in room mobility, or ADL completion.  Reviewed precautions with good understanding, and all questions answered, no further needs in the acute setting.         If plan is discharge home, recommend the following: Assist for transportation    Functional Status Assessment  Patient has not had a recent decline in their functional status  Equipment Recommendations  None recommended by OT    Recommendations for Other Services       Precautions / Restrictions Precautions Precautions: Cervical Precaution Booklet Issued: Yes (comment) Required Braces or Orthoses: Cervical Brace Cervical Brace: Hard collar;At all times Restrictions Weight Bearing Restrictions Per Provider Order: Yes LUE Weight Bearing Per Provider Order: Non weight bearing Other Position/Activity Restrictions: non weight bearing through L elbow      Mobility Bed Mobility Overal bed mobility: Modified Independent                  Transfers Overall transfer level: Modified independent                        Balance Overall balance assessment: No apparent balance deficits (not formally assessed)                                         ADL either performed or assessed with clinical judgement   ADL Overall ADL's : At baseline                                             Vision Patient Visual Report: No change from baseline       Perception Perception: Within  Functional Limits       Praxis Praxis: WFL       Pertinent Vitals/Pain Pain Assessment Pain Assessment: Faces Faces Pain Scale: Hurts a little bit Pain Location: incision, L elbow Pain Descriptors / Indicators: Discomfort, Sore Pain Intervention(s): Monitored during session     Extremity/Trunk Assessment Upper Extremity Assessment Upper Extremity Assessment: Overall WFL for tasks assessed;LUE deficits/detail LUE Deficits / Details: L elbo nn decompression LUE Sensation: decreased light touch LUE Coordination: WNL   Lower Extremity Assessment Lower Extremity Assessment: Defer to PT evaluation   Cervical / Trunk Assessment Cervical / Trunk Assessment: Neck Surgery   Communication Communication Communication: No apparent difficulties Cueing Techniques: Verbal cues   Cognition Arousal: Alert Behavior During Therapy: WFL for tasks assessed/performed Overall Cognitive Status: Within Functional Limits for tasks assessed                                       General Comments  VSS on RA    Exercises     Shoulder Instructions  Home Living Family/patient expects to be discharged to:: Private residence Living Arrangements: Spouse/significant other Available Help at Discharge: Family Type of Home: House Home Access: Stairs to enter Secretary/administrator of Steps: 2 Entrance Stairs-Rails: Right;Left;Can reach both Home Layout: Able to live on main level with bedroom/bathroom;Multi-level Alternate Level Stairs-Number of Steps: flight   Bathroom Shower/Tub: Producer, Television/film/video: Handicapped height Bathroom Accessibility: Yes   Home Equipment: Grab bars - tub/shower;Shower seat;Hand held shower head          Prior Functioning/Environment Prior Level of Function : Independent/Modified Independent;Working/employed;Driving             Mobility Comments: independent, works from home ADLs Comments: independent        OT  Problem List: Pain      OT Treatment/Interventions:      OT Goals(Current goals can be found in the care plan section) Acute Rehab OT Goals Patient Stated Goal: Return home OT Goal Formulation: With patient Time For Goal Achievement: 12/12/23 Potential to Achieve Goals: Good  OT Frequency:      Co-evaluation              AM-PAC OT 6 Clicks Daily Activity     Outcome Measure Help from another person eating meals?: None Help from another person taking care of personal grooming?: None Help from another person toileting, which includes using toliet, bedpan, or urinal?: None Help from another person bathing (including washing, rinsing, drying)?: None Help from another person to put on and taking off regular upper body clothing?: None Help from another person to put on and taking off regular lower body clothing?: None 6 Click Score: 24   End of Session Nurse Communication: Mobility status  Activity Tolerance: Patient tolerated treatment well Patient left: in chair;with call bell/phone within reach  OT Visit Diagnosis: Unsteadiness on feet (R26.81)                Time: 8994-8977 OT Time Calculation (min): 17 min Charges:  OT General Charges $OT Visit: 1 Visit OT Evaluation $OT Eval Moderate Complexity: 1 Mod  12/09/2023  RP, OTR/L  Acute Rehabilitation Services  Office:  [phone]   Charlie JONETTA Halsted 12/09/2023, 10:27 AM

## 2023-12-09 NOTE — Progress Notes (Signed)
 Patient alert and orient, ambulate, void. D/c instructions explain and given all questions answered. Surgical site clean and dry no sign of infections. Pt. D/c home per order.

## 2023-12-09 NOTE — Discharge Instructions (Signed)
  Wound Care Keep incision covered and dry until post op day 3. You may remove the Honeycomb dressing on post op day 3. Leave steri-strips on neck.  They will fall off by themselves. Do not put any creams, lotions, or ointments on incision. You are fine to shower. Let water run over incision and pat dry.  Activity Walk each and every day, increasing distance each day. No lifting greater than 8 lbs.  Avoid excessive neck motion. No driving, you can ride as a passenger Wear your collar at all times except for shower.  Diet Resume your normal diet.   Return to Work Will be discussed at your follow up appointment.  Call Your Doctor If Any of These Occur Redness, drainage, or swelling at the wound.  Temperature greater than 101 degrees. Severe pain not relieved by pain medication. Incision starts to come apart.  Follow Up Appt Call [phone] if you have one or any problem.

## 2023-12-09 NOTE — Discharge Summary (Signed)
  Patient ID: Eugene Mcdaniel MRN: [id number] DOB/AGE: 06/07/1951 73 y.o.  Admit date: 12/08/2023 Discharge date: 12/09/2023  Admission Diagnoses: Cervical radiculopathy [M54.12]   Discharge Diagnoses: Same   Discharged Condition: Stable  Hospital Course:  Eugene Mcdaniel is a 73 y.o. male who was admitted following an uncomplicated ACDF C6-T1, L ulnar decompression. They were recovered in PACU and transferred to [facility]. Hospital course was uncomplicated. Pt stable for discharge today. Pt to f/u in office for routine post op visit. Pt is in agreement w/ plan.    Discharge Exam: Blood pressure 121/70, pulse 64, temperature 98.1 F (36.7 Mcdaniel), temperature source Oral, resp. rate 20, height 5' 10 (1.778 m), weight 83.9 kg, SpO2 96%. A&O x3 Speech fluent, appropriate Strength grossly intact BUE/BLE SILTx4.  Dressing Mcdaniel/d/I.   Disposition: Discharge disposition: 01-Home or Self Care       Discharge Instructions     Incentive spirometry RT   Complete by: As directed       Allergies as of 12/09/2023       Reactions   Atorvastatin Other (See Comments)   myalgias   Simvastatin Other (See Comments)   myalgias        Medication List     TAKE these medications    ascorbic acid 500 MG tablet Commonly known as: VITAMIN Mcdaniel Take 500 mg by mouth daily.   Betaine HCl 650-130 MG Caps Take by mouth.   carboxymethylcellulose 0.5 % Soln Commonly known as: REFRESH PLUS Place 1 drop into both eyes 3 (three) times daily as needed (dry eyes).   docusate sodium  100 MG capsule Commonly known as: COLACE Take 1 capsule (100 mg total) by mouth 2 (two) times daily.   doxazosin  8 MG tablet Commonly known as: CARDURA  TAKE 1 TABLET BY MOUTH  DAILY   Fish Oil 1000 MG Cpdr Take 1,000 mg by mouth daily.   fluticasone  50 MCG/ACT nasal spray Commonly known as: FLONASE  Place 1 spray into both nostrils daily.   HYDROcodone -acetaminophen  5-325 MG tablet Commonly known as:  NORCO/VICODIN Take 2 tablets by mouth every 6 (six) hours as needed for severe pain (pain score 7-10).   levocetirizine 5 MG tablet Commonly known as: XYZAL  Take 1 tablet (5 mg total) by mouth every evening.   Mens Multivitamin Tabs Take 1 tablet by mouth daily.   methocarbamol  500 MG tablet Commonly known as: ROBAXIN  Take 1 tablet (500 mg total) by mouth every 6 (six) hours as needed for muscle spasms.   montelukast  10 MG tablet Commonly known as: SINGULAIR  Take 1 tablet (10 mg total) by mouth at bedtime. What changed: when to take this   NON FORMULARY Pt uses a Mcdaniel-pap nightly   omeprazole  20 MG capsule Commonly known as: PRILOSEC  TAKE 1 CAPSULE DAILY   oxymetazoline 0.05 % nasal spray Commonly known as: AFRIN Place 1 spray into both nostrils daily as needed for congestion.        Follow-up Information     Dawley, Troy C, DO. Call.   Why: As needed, If symptoms worsen Contact information: [address] [place] [address] [phone]                 Signed: Natilie Krabbenhoft Eugene Mcdaniel 12/09/2023, 9:50 AM

## 2023-12-19 DIAGNOSIS — M4722 Other spondylosis with radiculopathy, cervical region: Secondary | ICD-10-CM | POA: Diagnosis not present

## 2023-12-20 ENCOUNTER — Other Ambulatory Visit: Payer: Self-pay

## 2023-12-20 MED ORDER — OMEPRAZOLE 20 MG PO CPDR: 20.0000 mg | DELAYED_RELEASE_CAPSULE | Freq: Every day | ORAL | 3 refills | Status: AC

## 2023-12-20 MED ORDER — MONTELUKAST SODIUM 10 MG PO TABS: 10.0000 mg | ORAL_TABLET | Freq: Every day | ORAL | 3 refills | Status: DC

## 2023-12-20 MED ORDER — LEVOCETIRIZINE DIHYDROCHLORIDE 5 MG PO TABS: 5.0000 mg | ORAL_TABLET | Freq: Every evening | ORAL | 0 refills | Status: DC

## 2023-12-20 NOTE — Telephone Encounter (Signed)
Copied from CRM [id number]. Topic: Clinical - Medication Question >> Dec 20, 2023 11:46 AM Almira Coaster wrote: Reason for CRM: Jasmine December from [facility] is calling to update patient's pharmacy on file and status on a refill request for montelukast (SINGULAIR) 10 MG tablet, levocetirizine (XYZAL) 5 MG tablet & omeprazole (PRILOSEC) 20 MG capsule to be sent to [facility]. Their fax number is [phone]. Please follow up with patient once prescriptions have been sent to new pharmacy.  Rx refills printed and sent via fax since unable to send electronically at this time. Pt has been advised refills sent. No further action needed at this time

## 2023-12-20 NOTE — Telephone Encounter (Signed)
Copied from CRM [id number]. Topic: Clinical - Medication Question >> Dec 20, 2023 11:46 AM Almira Coaster wrote: Reason for CRM: Jasmine December from [facility] is calling to update patient's pharmacy on file and status on a refill request for montelukast (SINGULAIR) 10 MG tablet, levocetirizine (XYZAL) 5 MG tablet & omeprazole (PRILOSEC) 20 MG capsule to be sent to [facility]. Their fax number is [phone]. Please follow up with patient once prescriptions have been sent to new pharmacy.  Prescriptions printed due to RX refill errors with pharmacy on file. Faxed to [phone] per instructions.

## 2024-01-12 DIAGNOSIS — G4733 Obstructive sleep apnea (adult) (pediatric): Secondary | ICD-10-CM | POA: Diagnosis not present

## 2024-01-16 DIAGNOSIS — M4322 Fusion of spine, cervical region: Secondary | ICD-10-CM | POA: Diagnosis not present

## 2024-01-16 DIAGNOSIS — M25519 Pain in unspecified shoulder: Secondary | ICD-10-CM | POA: Diagnosis not present

## 2024-01-18 ENCOUNTER — Telehealth (INDEPENDENT_AMBULATORY_CARE_PROVIDER_SITE_OTHER): Payer: Self-pay | Admitting: Otolaryngology

## 2024-01-18 NOTE — Telephone Encounter (Signed)
LVM to confirm appt & location [id number] afm

## 2024-01-19 ENCOUNTER — Encounter (INDEPENDENT_AMBULATORY_CARE_PROVIDER_SITE_OTHER): Payer: Self-pay

## 2024-01-19 ENCOUNTER — Ambulatory Visit (INDEPENDENT_AMBULATORY_CARE_PROVIDER_SITE_OTHER): Payer: Medicare Other | Admitting: Otolaryngology

## 2024-01-19 VITALS — BP 124/76 | HR 71 | Ht 70.5 in | Wt 185.0 lb

## 2024-01-19 DIAGNOSIS — H608X3 Other otitis externa, bilateral: Secondary | ICD-10-CM

## 2024-01-19 DIAGNOSIS — G4733 Obstructive sleep apnea (adult) (pediatric): Secondary | ICD-10-CM

## 2024-01-19 DIAGNOSIS — R0982 Postnasal drip: Secondary | ICD-10-CM

## 2024-01-19 DIAGNOSIS — J343 Hypertrophy of nasal turbinates: Secondary | ICD-10-CM | POA: Diagnosis not present

## 2024-01-19 DIAGNOSIS — M25519 Pain in unspecified shoulder: Secondary | ICD-10-CM | POA: Diagnosis not present

## 2024-01-19 DIAGNOSIS — R0981 Nasal congestion: Secondary | ICD-10-CM | POA: Diagnosis not present

## 2024-01-19 DIAGNOSIS — J310 Chronic rhinitis: Secondary | ICD-10-CM

## 2024-01-19 DIAGNOSIS — M4322 Fusion of spine, cervical region: Secondary | ICD-10-CM | POA: Diagnosis not present

## 2024-01-19 DIAGNOSIS — R42 Dizziness and giddiness: Secondary | ICD-10-CM | POA: Diagnosis not present

## 2024-01-19 MED ORDER — MOMETASONE FUROATE 0.1 % EX CREA: TOPICAL_CREAM | CUTANEOUS | 5 refills | Status: DC

## 2024-01-20 DIAGNOSIS — H52203 Unspecified astigmatism, bilateral: Secondary | ICD-10-CM | POA: Diagnosis not present

## 2024-01-20 DIAGNOSIS — R0982 Postnasal drip: Secondary | ICD-10-CM | POA: Insufficient documentation

## 2024-01-20 DIAGNOSIS — H608X3 Other otitis externa, bilateral: Secondary | ICD-10-CM | POA: Insufficient documentation

## 2024-01-20 DIAGNOSIS — R42 Dizziness and giddiness: Secondary | ICD-10-CM | POA: Insufficient documentation

## 2024-01-20 NOTE — Progress Notes (Signed)
Patient ID: Eugene Mcdaniel, male   DOB: Feb 21, 1951, 73 y.o.   MRN: [id number]  CC: Chronic nasal obstruction, postnasal drainage, recurrent dizziness, itchy ears  HPI:  Eugene Mcdaniel is a 73 y.o. male who presents today with multiple medical complaints, including chronic nasal obstruction, postnasal drainage, recurrent dizziness, and itchy ears.  According to the patient, he has been symptomatic for many years.  He has significant difficulty breathing through his nose.  He has a history of obstructive sleep apnea, and is currently using a CPAP machine at night.  However, due to his chronic nasal obstruction, he is having significant difficulty with the CPAP machine.  He has been using Afrin regularly.  He also complains of frequent postnasal drainage and sneezing spells.  In addition, he also complains of occasional dizziness.  He describes the dizziness as a lightheaded and off-balance sensation.  He denies any otalgia, otorrhea, or spinning vertigo.  He has a history of environmental allergies.  He is currently seeing his allergist Dr. Eileen Stanford.  He is currently on Singulair, Flonase, and Xyzal.  He has been using allergy medications for years.  The patient previously underwent septoplasty surgery in the 1980s.  He also complains of frequent itchy sensation in his ears.  He has no history of otologic surgery.  Past Medical History:  Diagnosis Date   Allergy    Colon polyp    GERD (gastroesophageal reflux disease)    Hypercholesterolemia    Hyperplastic colon polyp 12/28/2017   Colonoscopy 2018, Dr. Rhetta Mura, repeat 10 years.   Primary osteoarthritis involving multiple joints    Seasonal allergic rhinitis due to pollen    Sleep apnea     Past Surgical History:  Procedure Laterality Date   ANTERIOR CERVICAL DECOMP/DISCECTOMY FUSION N/A 12/08/2023   Procedure: CERVICAL SIX-SEVEN, CERVICAL SEVEN-THORACIC ONE ANTERIOR CERIVCAL DECOMPRESSION/DISCECTOMY FUSION;  Surgeon: Dawley, Alan Mulder, DO;  Location:  [facility] OR;  Service: Neurosurgery;  Laterality: N/A;   APPENDECTOMY     colonoscopy   06/2005   MEDIAL COLLATERAL LIGAMENT REPAIR, KNEE Right    football injury   TONSILLECTOMY AND ADENOIDECTOMY  1962   ULNAR NERVE TRANSPOSITION Left 12/08/2023   Procedure: ULNAR NERVE DECOMPRESSION/TRANSPOSITION;  Surgeon: Dawley, Alan Mulder, DO;  Location: [facility] OR;  Service: Neurosurgery;  Laterality: Left;    Family History  Problem Relation Age of Onset   Lung cancer Mother    Arthritis Father    Heart disease Father    Healthy Daughter    Healthy Sister    Diabetes Neg Hx     Social History:  reports that he quit smoking about 37 years ago. His smoking use included cigarettes. He started smoking about 67 years ago. He has a 60 pack-year smoking history. He has quit using smokeless tobacco. He reports current alcohol use. He reports that he does not use drugs.  Allergies:  Allergies  Allergen Reactions   Atorvastatin Other (See Comments)    myalgias   Simvastatin Other (See Comments)    myalgias    Prior to Admission medications   Medication Sig Start Date End Date Taking? Authorizing Provider  carboxymethylcellulose (REFRESH PLUS) 0.5 % SOLN Place 1 drop into both eyes 3 (three) times daily as needed (dry eyes).   Yes [provider]  doxazosin (CARDURA) 8 MG tablet TAKE 1 TABLET BY MOUTH  DAILY 01/26/21  Yes Bjorn Pippin, MD  fluticasone [facility]) 50 MCG/ACT nasal spray Place 1 spray into both nostrils daily. 01/17/23  Yes  Willow Ora, MD  levocetirizine (XYZAL) 5 MG tablet Take 1 tablet (5 mg total) by mouth every evening. 12/20/23  Yes Willow Ora, MD  mometasone (ELOCON) 0.1 % cream Apply topically daily as needed for itch 01/19/24  Yes Newman Pies, MD  montelukast (SINGULAIR) 10 MG tablet Take 1 tablet (10 mg total) by mouth at bedtime. 12/20/23  Yes Willow Ora, MD  Multiple Vitamins-Minerals (MENS MULTIVITAMIN) TABS Take 1 tablet by mouth daily.   Yes [provider]   Omega-3 Fatty Acids (FISH OIL) 1000 MG CPDR Take 1,000 mg by mouth daily.   Yes [provider]  omeprazole (PRILOSEC) 20 MG capsule Take 1 capsule (20 mg total) by mouth daily. 12/20/23  Yes Willow Ora, MD  oxymetazoline (AFRIN) 0.05 % nasal spray Place 1 spray into both nostrils daily as needed for congestion.   Yes [provider]  ascorbic acid (VITAMIN C) 500 MG tablet Take 500 mg by mouth daily.    [provider]  Digestive Enzymes (BETAINE HCL) 650-130 MG CAPS Take by mouth. Patient not taking: Reported on 12/02/2023    [provider]  docusate sodium (COLACE) 100 MG capsule Take 1 capsule (100 mg total) by mouth 2 (two) times daily. Patient not taking: Reported on 01/19/2024 12/09/23   Clovis Riley, PA-C  HYDROcodone-acetaminophen (NORCO/VICODIN) 5-325 MG tablet Take 2 tablets by mouth every 6 (six) hours as needed for severe pain (pain score 7-10). Patient not taking: Reported on 01/19/2024 12/09/23   Patrici Ranks Caylin, PA-C  methocarbamol (ROBAXIN) 500 MG tablet Take 1 tablet (500 mg total) by mouth every 6 (six) hours as needed for muscle spasms. Patient not taking: Reported on 01/19/2024 12/09/23   Clovis Riley, PA-C  NON FORMULARY Pt uses a c-pap nightly    [provider]    Blood pressure 124/76, pulse 71, height 5' 10.5" (1.791 m), weight 185 lb (83.9 kg), SpO2 97%. Exam: General: Communicates without difficulty, well nourished, no acute distress. Head: Normocephalic, no evidence injury, no tenderness, facial buttresses intact without stepoff. Face/sinus: No tenderness to palpation and percussion. Facial movement is normal and symmetric. Eyes: PERRL, EOMI. No scleral icterus, conjunctivae clear. Neuro: CN II exam reveals vision grossly intact.  No nystagmus at any point of gaze. Ears: Auricles well formed without lesions.  Ear canals are intact with eczematous changes bilaterally.  No erythema or edema is  appreciated.  The TMs are intact without fluid. Nose: External evaluation reveals normal support and skin without lesions.  Dorsum is intact.  Anterior rhinoscopy reveals congested mucosa over anterior aspect of inferior turbinates and intact septum.  No purulence noted. Oral:  Oral cavity and oropharynx are intact, symmetric, without erythema or edema.  Mucosa is moist without lesions. Neck: Full range of motion without pain.  There is no significant lymphadenopathy.  No masses palpable.  Thyroid bed within normal limits to palpation.  Parotid glands and submandibular glands equal bilaterally without mass.  Trachea is midline. Neuro:  CN 2-12 grossly intact. Vestibular: No nystagmus at any point of gaze. Dix Hallpike negative. Vestibular: There is no nystagmus with pneumatic pressure on either tympanic membrane or Valsalva. The cerebellar examination is unremarkable.    Procedure:  Flexible Nasal Endoscopy: Description: Risks, benefits, and alternatives of flexible endoscopy were explained to the patient.  Specific mention was made of the risk of throat numbness with difficulty swallowing, possible bleeding from the nose and mouth, and pain from the procedure.  The patient gave oral consent to proceed.  The flexible scope was inserted into the right nasal cavity.  Endoscopy of the interior nasal cavity, superior, inferior, and middle meatus was performed. The sphenoid-ethmoid recess was examined. Edematous mucosa was noted.  No polyp, mass, or lesion was appreciated. Olfactory cleft was clear.  Nasopharynx was clear.  Turbinates were hypertrophied but without mass.  The procedure was repeated on the contralateral side with similar findings.  The patient tolerated the procedure well.   Assessment: 1.  Chronic rhinitis with nasal mucosal congestion and bilateral inferior turbinate hypertrophy.  More than 95% of his nasal passageways are obstructed bilaterally.  The patient has not responded to treatment with  allergy medications for years. 2.  Chronic postnasal drainage. 3.  Recurrent dizziness of unknown etiology. The possible differential diagnoses include transient BPPV, vestibular migraine, Meniere's disease, peripheral vestibular dysfunction, or other central/systemic causes.   4.  Bilateral chronic eczematous otitis externa. 5.  Obstructive sleep apnea.  The patient is currently using a nasal CPAP machine.  Plan: 1.  The physical exam and nasal endoscopy findings are reviewed with the patient. 2.  Elocon cream to treat the chronic eczematous otitis externa. 3.  The pathophysiology of vestibular dysfunction and dizziness are discussed with the patient. The possible differential diagnoses are reviewed.  4.  Based on the above findings, the patient will likely benefit from surgical intervention with bilateral inferior turbinate reduction.  The risk, benefits, alternatives, and details of the procedure are discussed.  Questions are invited and answered. 5.  The patient will like to proceed with the procedure.  Yonael Tulloch W Derrian Poli 01/20/2024, 2:29 PM

## 2024-01-25 NOTE — Progress Notes (Unsigned)
 [facility] [address] [place] [address] Phone: [phone] Subjective:   Eugene Mcdaniel am a scribe for Dr. Katrinka Blazing.   I'm seeing this patient by the request  of:  Willow Ora, MD  CC: Right knee pain  [id number]  [name] is a 73 y.o. male coming in with complaint of R knee pain. Patient states chronic with arthritis. Has been inactive due to surgery so no pain during that time. Started walking again and it is painful. Mostly walking downhill. Trouble bending knee. Swelling in the right ankle was well.        Past Medical History:  Diagnosis Date   Allergy    Colon polyp    GERD (gastroesophageal reflux disease)    Hypercholesterolemia    Hyperplastic colon polyp 12/28/2017   Colonoscopy 2018, Dr. Rhetta Mura, repeat 10 years.   Primary osteoarthritis involving multiple joints    Seasonal allergic rhinitis due to pollen    Sleep apnea    Past Surgical History:  Procedure Laterality Date   ANTERIOR CERVICAL DECOMP/DISCECTOMY FUSION N/A 12/08/2023   Procedure: CERVICAL SIX-SEVEN, CERVICAL SEVEN-THORACIC ONE ANTERIOR CERIVCAL DECOMPRESSION/DISCECTOMY FUSION;  Surgeon: Dawley, Alan Mulder, DO;  Location: [facility] OR;  Service: Neurosurgery;  Laterality: N/A;   APPENDECTOMY     colonoscopy   06/2005   MEDIAL COLLATERAL LIGAMENT REPAIR, KNEE Right    football injury   TONSILLECTOMY AND ADENOIDECTOMY  1962   ULNAR NERVE TRANSPOSITION Left 12/08/2023   Procedure: ULNAR NERVE DECOMPRESSION/TRANSPOSITION;  Surgeon: Dawley, Alan Mulder, DO;  Location: [facility] OR;  Service: Neurosurgery;  Laterality: Left;   Social History   Socioeconomic History   Marital status: Married    Spouse name: Renee   Number of children: 1   Years of education: Not on file   Highest education level: Not on file  Occupational History   Occupation: sales/business owner    Comment: [employer] display sales/retail store displays  Tobacco Use   Smoking status: Former    Current  packs/day: 0.00    Average packs/day: 2.0 packs/day for 30.0 years (60.0 ttl pk-yrs)    Types: Cigarettes    Start date: 11/29/1956    Quit date: 11/29/1986    Years since quitting: 37.1   Smokeless tobacco: Former  Building services engineer status: Never Used  Substance and Sexual Activity   Alcohol use: Yes    Comment: Drinks every night   Drug use: No   Sexual activity: Yes  Other Topics Concern   Not on file  Social History Narrative   Not on file   Social Drivers of Health   Financial Resource Strain: Not on file  Food Insecurity: No Food Insecurity (12/08/2023)   Hunger Vital Sign    Worried About Running Out of Food in the Last Year: Never true    Ran Out of Food in the Last Year: Never true  Transportation Needs: No Transportation Needs (12/08/2023)   PRAPARE - Administrator, Civil Service (Medical): No    Lack of Transportation (Non-Medical): No  Physical Activity: Not on file  Stress: Not on file  Social Connections: Unknown (12/08/2023)   Social Connection and Isolation Panel [NHANES]    Frequency of Communication with Friends and Family: Patient declined    Frequency of Social Gatherings with Friends and Family: Patient declined    Attends Religious Services: Patient unable to answer    Active Member of Clubs or Organizations: Patient declined  Attends Banker Meetings: Patient declined    Marital Status: Patient declined   Allergies  Allergen Reactions   Atorvastatin Other (See Comments)    myalgias   Simvastatin Other (See Comments)    myalgias   Family History  Problem Relation Age of Onset   Lung cancer Mother    Arthritis Father    Heart disease Father    Healthy Daughter    Healthy Sister    Diabetes Neg Hx      Current Outpatient Medications (Cardiovascular):    doxazosin (CARDURA) 8 MG tablet, TAKE 1 TABLET BY MOUTH  DAILY  Current Outpatient Medications (Respiratory):    fluticasone (FLONASE) 50 MCG/ACT nasal spray, Place  1 spray into both nostrils daily.   levocetirizine (XYZAL) 5 MG tablet, Take 1 tablet (5 mg total) by mouth every evening.   montelukast (SINGULAIR) 10 MG tablet, Take 1 tablet (10 mg total) by mouth at bedtime.   oxymetazoline (AFRIN) 0.05 % nasal spray, Place 1 spray into both nostrils daily as needed for congestion.  Current Outpatient Medications (Analgesics):    HYDROcodone-acetaminophen (NORCO/VICODIN) 5-325 MG tablet, Take 2 tablets by mouth every 6 (six) hours as needed for severe pain (pain score 7-10). (Patient not taking: Reported on 01/19/2024)   Current Outpatient Medications (Other):    ascorbic acid (VITAMIN C) 500 MG tablet, Take 500 mg by mouth daily.   carboxymethylcellulose (REFRESH PLUS) 0.5 % SOLN, Place 1 drop into both eyes 3 (three) times daily as needed (dry eyes).   Digestive Enzymes (BETAINE HCL) 650-130 MG CAPS, Take by mouth. (Patient not taking: Reported on 12/02/2023)   docusate sodium (COLACE) 100 MG capsule, Take 1 capsule (100 mg total) by mouth 2 (two) times daily. (Patient not taking: Reported on 01/19/2024)   methocarbamol (ROBAXIN) 500 MG tablet, Take 1 tablet (500 mg total) by mouth every 6 (six) hours as needed for muscle spasms. (Patient not taking: Reported on 01/19/2024)   mometasone (ELOCON) 0.1 % cream, Apply topically daily as needed for itch   Multiple Vitamins-Minerals (MENS MULTIVITAMIN) TABS, Take 1 tablet by mouth daily.   NON FORMULARY, Pt uses a c-pap nightly   Omega-3 Fatty Acids (FISH OIL) 1000 MG CPDR, Take 1,000 mg by mouth daily.   omeprazole (PRILOSEC) 20 MG capsule, Take 1 capsule (20 mg total) by mouth daily.   Reviewed prior external information including notes and imaging from  primary care provider As well as notes that were available from care everywhere and other healthcare systems.  Past medical history, social, surgical and family history all reviewed in electronic medical record.  No pertanent information unless stated  regarding to the chief complaint.   Review of Systems:  No headache, visual changes, nausea, vomiting, diarrhea, constipation, dizziness, abdominal pain, skin rash, fevers, chills, night sweats, weight loss, swollen lymph nodes, body aches, joint swelling, chest pain, shortness of breath, mood changes. POSITIVE muscle aches  Objective  Blood pressure (!) 142/70, pulse 67, height 5' 10.5" (1.791 m), weight 195 lb 3.2 oz (88.5 kg), SpO2 (!) 84%.   General: No apparent distress alert and oriented x3 mood and affect normal, dressed appropriately.  HEENT: Pupils equal, extraocular movements intact  Respiratory: Patient's speak in full sentences and does not appear short of breath  Cardiovascular: No lower extremity edema, non tender, no erythema  Right knee exam shows crepitus noted.  Patient does have some very mild instability noted.  Tenderness to palpation more over the lateral aspect of the joint.  Patient on exam though did have unfortunately tenderness in the calf area.  Limited muscular skeletal ultrasound was performed and interpreted by Antoine Primas, M  Limited ultrasound shows some mild hypoechoic changes of mild narrowing of the patellofemoral joint.  Patient does have hypoechoic changes significant for the popliteal tendon and does seem to have some subluxation noted. Impression: Popliteal tendinitis    Impression and Recommendations:    The above documentation has been reviewed and is accurate and complete Judi Saa, DO

## 2024-01-27 ENCOUNTER — Other Ambulatory Visit: Payer: Self-pay

## 2024-01-27 ENCOUNTER — Encounter: Payer: Self-pay | Admitting: Family Medicine

## 2024-01-27 ENCOUNTER — Ambulatory Visit: Payer: Medicare Other | Admitting: Family Medicine

## 2024-01-27 VITALS — BP 142/70 | HR 67 | Ht 70.5 in | Wt 195.2 lb

## 2024-01-27 DIAGNOSIS — M76891 Other specified enthesopathies of right lower limb, excluding foot: Secondary | ICD-10-CM | POA: Diagnosis not present

## 2024-01-27 DIAGNOSIS — M2550 Pain in unspecified joint: Secondary | ICD-10-CM

## 2024-01-27 DIAGNOSIS — M25561 Pain in right knee: Secondary | ICD-10-CM

## 2024-01-27 LAB — IBC PANEL
Iron: 94 ug/dL (ref 42–165)
Saturation Ratios: 28.2 % (ref 20.0–50.0)
TIBC: 333.2 ug/dL (ref 250.0–450.0)
Transferrin: 238 mg/dL (ref 212.0–360.0)

## 2024-01-27 LAB — CBC WITH DIFFERENTIAL/PLATELET
Basophils Absolute: 0 10*3/uL (ref 0.0–0.1)
Basophils Relative: 0.4 % (ref 0.0–3.0)
Eosinophils Absolute: 0.1 10*3/uL (ref 0.0–0.7)
Eosinophils Relative: 1.9 % (ref 0.0–5.0)
HCT: 45.9 % (ref 39.0–52.0)
Hemoglobin: 15.4 g/dL (ref 13.0–17.0)
Lymphocytes Relative: 33.3 % (ref 12.0–46.0)
Lymphs Abs: 1.2 10*3/uL (ref 0.7–4.0)
MCHC: 33.5 g/dL (ref 30.0–36.0)
MCV: 96.8 fL (ref 78.0–100.0)
Monocytes Absolute: 0.3 10*3/uL (ref 0.1–1.0)
Monocytes Relative: 9 % (ref 3.0–12.0)
Neutro Abs: 2.1 10*3/uL (ref 1.4–7.7)
Neutrophils Relative %: 55.4 % (ref 43.0–77.0)
Platelets: 221 10*3/uL (ref 150.0–400.0)
RBC: 4.74 Mil/uL (ref 4.22–5.81)
RDW: 14.2 % (ref 11.5–15.5)
WBC: 3.7 10*3/uL — ABNORMAL LOW (ref 4.0–10.5)

## 2024-01-27 LAB — COMPREHENSIVE METABOLIC PANEL
ALT: 21 U/L (ref 0–53)
AST: 18 U/L (ref 0–37)
Albumin: 4.6 g/dL (ref 3.5–5.2)
Alkaline Phosphatase: 49 U/L (ref 39–117)
BUN: 15 mg/dL (ref 6–23)
CO2: 28 meq/L (ref 19–32)
Calcium: 9.6 mg/dL (ref 8.4–10.5)
Chloride: 103 meq/L (ref 96–112)
Creatinine, Ser: 1.15 mg/dL (ref 0.40–1.50)
GFR: 63.05 mL/min (ref >60.00)
Glucose, Bld: 105 mg/dL — ABNORMAL HIGH (ref 70–99)
Potassium: 4.2 meq/L (ref 3.5–5.1)
Sodium: 140 meq/L (ref 135–145)
Total Bilirubin: 0.9 mg/dL (ref 0.2–1.2)
Total Protein: 7.2 g/dL (ref 6.0–8.3)

## 2024-01-27 LAB — FERRITIN: Ferritin: 139.8 ng/mL (ref 22.0–322.0)

## 2024-01-27 LAB — VITAMIN B12: Vitamin B-12: 422 pg/mL (ref 211–911)

## 2024-01-27 LAB — VITAMIN D 25 HYDROXY (VIT D DEFICIENCY, FRACTURES): VITD: 43.69 ng/mL (ref 30.00–100.00)

## 2024-01-27 NOTE — Patient Instructions (Addendum)
 Good to see you. Labs today Compression sleeve Ice and voltaren CBD at least (KOI) See you again in 5-6 weeks

## 2024-01-27 NOTE — Assessment & Plan Note (Addendum)
 Patient does have more of a popliteus tendinitis noted.  Discussed with patient that there is some subluxation occurring.  Patient though does have recent surgery as well so we will rule out a deep venous thrombosis with a D-dimer.  If elevated will need to consider the possibility of Doppler ultrasound.  Discussed compression, icing regimen, home exercises and avoiding full range of motion.  Follow-up again in 6 to 8 weeks.  Will send to physical therapy as well.

## 2024-01-28 LAB — D-DIMER, QUANTITATIVE: D-Dimer, Quant: 0.53 ug{FEU}/mL — ABNORMAL HIGH (ref <0.50)

## 2024-01-30 ENCOUNTER — Encounter: Payer: Self-pay | Admitting: Family Medicine

## 2024-01-30 NOTE — Addendum Note (Signed)
 Addended by: Rolland Bimler D on: 01/30/2024 01:05 PM   Modules accepted: Orders

## 2024-01-30 NOTE — Telephone Encounter (Signed)
 Faxed to [facility]

## 2024-02-02 ENCOUNTER — Ambulatory Visit: Payer: Medicare Other | Admitting: Family Medicine

## 2024-02-02 DIAGNOSIS — J343 Hypertrophy of nasal turbinates: Secondary | ICD-10-CM | POA: Diagnosis not present

## 2024-02-02 DIAGNOSIS — J3489 Other specified disorders of nose and nasal sinuses: Secondary | ICD-10-CM | POA: Diagnosis not present

## 2024-02-02 HISTORY — PX: INFRACTURE OF INFERIOR TURBINATE: SHX1822

## 2024-02-06 ENCOUNTER — Ambulatory Visit (INDEPENDENT_AMBULATORY_CARE_PROVIDER_SITE_OTHER): Admitting: Otolaryngology

## 2024-02-06 ENCOUNTER — Encounter (INDEPENDENT_AMBULATORY_CARE_PROVIDER_SITE_OTHER): Payer: Self-pay

## 2024-02-06 VITALS — BP 133/81 | HR 67 | Ht 70.0 in | Wt 190.0 lb

## 2024-02-06 DIAGNOSIS — J343 Hypertrophy of nasal turbinates: Secondary | ICD-10-CM

## 2024-02-06 DIAGNOSIS — Z9889 Other specified postprocedural states: Secondary | ICD-10-CM

## 2024-02-06 NOTE — Progress Notes (Signed)
 Patient ID: Eugene Mcdaniel, male   DOB: Mar 18, 1951, 73 y.o.   MRN: [id number]  Ralph Leyden splints removed. Turbinates are healing well.   Both Empire debrided.  Nasal saline irrigation.  Recheck in 3 weeks.

## 2024-02-07 DIAGNOSIS — M25519 Pain in unspecified shoulder: Secondary | ICD-10-CM | POA: Diagnosis not present

## 2024-02-07 DIAGNOSIS — M4322 Fusion of spine, cervical region: Secondary | ICD-10-CM | POA: Diagnosis not present

## 2024-02-13 DIAGNOSIS — D034 Melanoma in situ of scalp and neck: Secondary | ICD-10-CM | POA: Diagnosis not present

## 2024-02-13 DIAGNOSIS — L570 Actinic keratosis: Secondary | ICD-10-CM | POA: Diagnosis not present

## 2024-02-13 DIAGNOSIS — L578 Other skin changes due to chronic exposure to nonionizing radiation: Secondary | ICD-10-CM | POA: Diagnosis not present

## 2024-02-13 DIAGNOSIS — L821 Other seborrheic keratosis: Secondary | ICD-10-CM | POA: Diagnosis not present

## 2024-02-13 DIAGNOSIS — D229 Melanocytic nevi, unspecified: Secondary | ICD-10-CM | POA: Diagnosis not present

## 2024-02-14 DIAGNOSIS — M25519 Pain in unspecified shoulder: Secondary | ICD-10-CM | POA: Diagnosis not present

## 2024-02-14 DIAGNOSIS — M4322 Fusion of spine, cervical region: Secondary | ICD-10-CM | POA: Diagnosis not present

## 2024-02-16 DIAGNOSIS — D034 Melanoma in situ of scalp and neck: Secondary | ICD-10-CM | POA: Diagnosis not present

## 2024-02-21 DIAGNOSIS — M25519 Pain in unspecified shoulder: Secondary | ICD-10-CM | POA: Diagnosis not present

## 2024-02-21 DIAGNOSIS — M4322 Fusion of spine, cervical region: Secondary | ICD-10-CM | POA: Diagnosis not present

## 2024-02-24 NOTE — Progress Notes (Deleted)
 [facility] [address] [place] [address] Phone: [phone] Subjective:    I'm seeing this patient by the request  of:  Willow Ora, MD  CC:   [id number]  01/27/2024 Patient does have more of a popliteus tendinitis noted.  Discussed with patient that there is some subluxation occurring.  Patient though does have recent surgery as well so we will rule out a deep venous thrombosis with a D-dimer.  If elevated will need to consider the possibility of Doppler ultrasound.  Discussed compression, icing regimen, home exercises and avoiding full range of motion.  Follow-up again in 6 to 8 weeks.  Will send to physical therapy as well.     Updated 03/01/2024 Eugene Mcdaniel is a 73 y.o. male coming in with complaint of polyarthralgia and R knee      Past Medical History:  Diagnosis Date   Allergy    Colon polyp    GERD (gastroesophageal reflux disease)    Hypercholesterolemia    Hyperplastic colon polyp 12/28/2017   Colonoscopy 2018, Dr. Rhetta Mura, repeat 10 years.   Primary osteoarthritis involving multiple joints    Seasonal allergic rhinitis due to pollen    Sleep apnea    Past Surgical History:  Procedure Laterality Date   ANTERIOR CERVICAL DECOMP/DISCECTOMY FUSION N/A 12/08/2023   Procedure: CERVICAL SIX-SEVEN, CERVICAL SEVEN-THORACIC ONE ANTERIOR CERIVCAL DECOMPRESSION/DISCECTOMY FUSION;  Surgeon: Dawley, Alan Mulder, DO;  Location: [facility] OR;  Service: Neurosurgery;  Laterality: N/A;   APPENDECTOMY     colonoscopy   06/2005   MEDIAL COLLATERAL LIGAMENT REPAIR, KNEE Right    football injury   TONSILLECTOMY AND ADENOIDECTOMY  1962   ULNAR NERVE TRANSPOSITION Left 12/08/2023   Procedure: ULNAR NERVE DECOMPRESSION/TRANSPOSITION;  Surgeon: Dawley, Alan Mulder, DO;  Location: [facility] OR;  Service: Neurosurgery;  Laterality: Left;   Social History   Socioeconomic History   Marital status: Married    Spouse name: Renee   Number of children: 1   Years of  education: Not on file   Highest education level: Not on file  Occupational History   Occupation: sales/business owner    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  Tobacco Use   Smoking status: Former    Current packs/day: 0.00    Average packs/day: 2.0 packs/day for 30.0 years (60.0 ttl pk-yrs)    Types: Cigarettes    Start date: 11/29/1956    Quit date: 11/29/1986    Years since quitting: 37.2   Smokeless tobacco: Former  Building services engineer status: Never Used  Substance and Sexual Activity   Alcohol use: Yes    Comment: Drinks every night   Drug use: No   Sexual activity: Yes  Other Topics Concern   Not on file  Social History Narrative   Not on file   Social Drivers of Health   Financial Resource Strain: Not on file  Food Insecurity: No Food Insecurity (12/08/2023)   Hunger Vital Sign    Worried About Running Out of Food in the Last Year: Never true    Ran Out of Food in the Last Year: Never true  Transportation Needs: No Transportation Needs (12/08/2023)   PRAPARE - Administrator, Civil Service (Medical): No    Lack of Transportation (Non-Medical): No  Physical Activity: Not on file  Stress: Not on file  Social Connections: Unknown (12/08/2023)   Social Connection and Isolation Panel [NHANES]    Frequency of Communication with Friends  and Family: Patient declined    Frequency of Social Gatherings with Friends and Family: Patient declined    Attends Religious Services: Patient unable to answer    Active Member of Clubs or Organizations: Patient declined    Attends Engineer, structural: Patient declined    Marital Status: Patient declined   Allergies  Allergen Reactions   Atorvastatin Other (See Comments)    myalgias   Simvastatin Other (See Comments)    myalgias   Family History  Problem Relation Age of Onset   Lung cancer Mother    Arthritis Father    Heart disease Father    Healthy Daughter    Healthy Sister    Diabetes Neg Hx       Current Outpatient Medications (Cardiovascular):    doxazosin (CARDURA) 8 MG tablet, TAKE 1 TABLET BY MOUTH  DAILY  Current Outpatient Medications (Respiratory):    fluticasone (FLONASE) 50 MCG/ACT nasal spray, Place 1 spray into both nostrils daily.   levocetirizine (XYZAL) 5 MG tablet, Take 1 tablet (5 mg total) by mouth every evening.   montelukast (SINGULAIR) 10 MG tablet, Take 1 tablet (10 mg total) by mouth at bedtime.   oxymetazoline (AFRIN) 0.05 % nasal spray, Place 1 spray into both nostrils daily as needed for congestion.  Current Outpatient Medications (Analgesics):    HYDROcodone-acetaminophen (NORCO/VICODIN) 5-325 MG tablet, Take 2 tablets by mouth every 6 (six) hours as needed for severe pain (pain score 7-10). (Patient not taking: Reported on 01/19/2024)   Current Outpatient Medications (Other):    ascorbic acid (VITAMIN C) 500 MG tablet, Take 500 mg by mouth daily.   carboxymethylcellulose (REFRESH PLUS) 0.5 % SOLN, Place 1 drop into both eyes 3 (three) times daily as needed (dry eyes).   Digestive Enzymes (BETAINE HCL) 650-130 MG CAPS, Take by mouth. (Patient not taking: Reported on 12/02/2023)   docusate sodium (COLACE) 100 MG capsule, Take 1 capsule (100 mg total) by mouth 2 (two) times daily. (Patient not taking: Reported on 01/19/2024)   methocarbamol (ROBAXIN) 500 MG tablet, Take 1 tablet (500 mg total) by mouth every 6 (six) hours as needed for muscle spasms. (Patient not taking: Reported on 01/19/2024)   mometasone (ELOCON) 0.1 % cream, Apply topically daily as needed for itch   Multiple Vitamins-Minerals (MENS MULTIVITAMIN) TABS, Take 1 tablet by mouth daily. (Patient not taking: Reported on 02/06/2024)   NON FORMULARY, Pt uses a c-pap nightly   Omega-3 Fatty Acids (FISH OIL) 1000 MG CPDR, Take 1,000 mg by mouth daily. (Patient not taking: Reported on 02/06/2024)   omeprazole (PRILOSEC) 20 MG capsule, Take 1 capsule (20 mg total) by mouth daily.   Reviewed prior  external information including notes and imaging from  primary care provider As well as notes that were available from care everywhere and other healthcare systems.  Past medical history, social, surgical and family history all reviewed in electronic medical record.  No pertanent information unless stated regarding to the chief complaint.   Review of Systems:  No headache, visual changes, nausea, vomiting, diarrhea, constipation, dizziness, abdominal pain, skin rash, fevers, chills, night sweats, weight loss, swollen lymph nodes, body aches, joint swelling, chest pain, shortness of breath, mood changes. POSITIVE muscle aches  Objective  There were no vitals taken for this visit.   General: No apparent distress alert and oriented x3 mood and affect normal, dressed appropriately.  HEENT: Pupils equal, extraocular movements intact  Respiratory: Patient's speak in full sentences and does not appear short  of breath  Cardiovascular: No lower extremity edema, non tender, no erythema      Impression and Recommendations:

## 2024-02-27 DIAGNOSIS — R972 Elevated prostate specific antigen [PSA]: Secondary | ICD-10-CM | POA: Diagnosis not present

## 2024-02-28 ENCOUNTER — Ambulatory Visit (INDEPENDENT_AMBULATORY_CARE_PROVIDER_SITE_OTHER)

## 2024-02-28 ENCOUNTER — Encounter (INDEPENDENT_AMBULATORY_CARE_PROVIDER_SITE_OTHER): Payer: Self-pay

## 2024-02-28 VITALS — BP 126/77 | HR 64

## 2024-02-28 DIAGNOSIS — M25519 Pain in unspecified shoulder: Secondary | ICD-10-CM | POA: Diagnosis not present

## 2024-02-28 DIAGNOSIS — R42 Dizziness and giddiness: Secondary | ICD-10-CM

## 2024-02-28 DIAGNOSIS — J310 Chronic rhinitis: Secondary | ICD-10-CM | POA: Diagnosis not present

## 2024-02-28 DIAGNOSIS — M4322 Fusion of spine, cervical region: Secondary | ICD-10-CM | POA: Diagnosis not present

## 2024-02-28 DIAGNOSIS — R0981 Nasal congestion: Secondary | ICD-10-CM | POA: Diagnosis not present

## 2024-02-28 NOTE — Progress Notes (Unsigned)
 Patient ID: Eugene Mcdaniel, male   DOB: 03-08-1951, 74 y.o.   MRN: [id number]  New complaint: Recurrent dizziness  HPI: The patient is a 73 year old male who presents today with new complaint of recurrent dizziness.  According to the patient, he has been symptomatic for 30+ years.  The dizziness is often triggered when he sees quick moving objects in front of him.  He describes the dizziness as a lightheaded and swimmy headed sensation.  He denies any otalgia or otorrhea.  He has a history of bilateral tinnitus and hearing loss.  He has no previous otologic surgery.  The patient was previously seen for chronic nasal obstruction and bilateral inferior turbinate hypertrophy.  He was treated with bilateral turbinate reduction surgery last month.  His nasal breathing has improved since his surgery.  Exam: General: Communicates without difficulty, well nourished, no acute distress. Head: Normocephalic, no evidence injury, no tenderness, facial buttresses intact without stepoff. Face/sinus: No tenderness to palpation and percussion. Facial movement is normal and symmetric. Eyes: PERRL, EOMI. No scleral icterus, conjunctivae clear. Neuro: CN II exam reveals vision grossly intact.  No nystagmus at any point of gaze. Ears: Auricles well formed without lesions.  Ear canals are intact without mass or lesion.  No erythema or edema is appreciated.  The TMs are intact without fluid. Nose: External evaluation reveals normal support and skin without lesions.  Dorsum is intact.  Anterior rhinoscopy reveals congested mucosa over anterior aspect of inferior turbinates and intact septum.  Both turbinates are well-healed.  Oral:  Oral cavity and oropharynx are intact, symmetric, without erythema or edema.  Mucosa is moist without lesions. Neck: Full range of motion without pain.  There is no significant lymphadenopathy.  No masses palpable.  Thyroid bed within normal limits to palpation.  Parotid glands and submandibular glands  equal bilaterally without mass.  Trachea is midline. Neuro:  CN 2-12 grossly intact. Vestibular: No nystagmus at any point of gaze. Vestibular: There is no nystagmus with pneumatic pressure on either tympanic membrane or Valsalva. The cerebellar examination is unremarkable.   Assessment: 1.  Recurrent dizziness of unknown etiology. The possible differential diagnoses include transient BPPV, vestibular migraine, Meniere's disease, peripheral vestibular dysfunction, or other central/systemic causes.   2.  Chronic rhinitis with mild nasal mucosal congestion.  His turbinates are well-healed from his previous surgery.  Plan: 1.  The physical exam findings are reviewed with the patient. 2.  The pathophysiology of vestibular dysfunction and dizziness are discussed extensively with the patient. The possible differential diagnoses are reviewed. Questions are invited and answered.   3.  The patient will likely benefit from undergoing physical therapy/vestibular rehabilitation to improve his balancing function. A referral will be arranged as soon as possible.  4.  If the patient continues to be symptomatic, he may benefit from vestibular neurodiagnostic testing at a tertiary care center to evaluate for possible vestibular dysfunction.

## 2024-03-01 ENCOUNTER — Ambulatory Visit: Attending: Otolaryngology

## 2024-03-01 ENCOUNTER — Ambulatory Visit: Payer: Medicare Other | Admitting: Family Medicine

## 2024-03-01 VITALS — BP 127/73

## 2024-03-01 DIAGNOSIS — R2681 Unsteadiness on feet: Secondary | ICD-10-CM | POA: Insufficient documentation

## 2024-03-01 DIAGNOSIS — R42 Dizziness and giddiness: Secondary | ICD-10-CM | POA: Insufficient documentation

## 2024-03-01 NOTE — Therapy (Signed)
 OUTPATIENT PHYSICAL THERAPY VESTIBULAR EVALUATION     Patient Name: Eugene Mcdaniel MRN: [id number] DOB:21-Oct-1951, 73 y.o., male Today's Date: 03/01/2024  END OF SESSION:  PT End of Session - 03/01/24 0754     Visit Number 1    Number of Visits 7    Date for PT Re-Evaluation 04/27/24   to allow for delay in scheduling   Authorization Type [employer] medicare    PT Start Time 0800    PT Stop Time 0850    PT Time Calculation (min) 50 min    Activity Tolerance Patient tolerated treatment well    Behavior During Therapy [facility] for tasks assessed/performed             Past Medical History:  Diagnosis Date   Allergy    Colon polyp    GERD (gastroesophageal reflux disease)    Hypercholesterolemia    Hyperplastic colon polyp 12/28/2017   Colonoscopy 2018, Dr. Rhetta Mura, repeat 10 years.   Primary osteoarthritis involving multiple joints    Seasonal allergic rhinitis due to pollen    Sleep apnea    Past Surgical History:  Procedure Laterality Date   ANTERIOR CERVICAL DECOMP/DISCECTOMY FUSION N/A 12/08/2023   Procedure: CERVICAL SIX-SEVEN, CERVICAL SEVEN-THORACIC ONE ANTERIOR CERIVCAL DECOMPRESSION/DISCECTOMY FUSION;  Surgeon: Dawley, Alan Mulder, DO;  Location: [facility] OR;  Service: Neurosurgery;  Laterality: N/A;   APPENDECTOMY     colonoscopy   06/2005   MEDIAL COLLATERAL LIGAMENT REPAIR, KNEE Right    football injury   TONSILLECTOMY AND ADENOIDECTOMY  1962   ULNAR NERVE TRANSPOSITION Left 12/08/2023   Procedure: ULNAR NERVE DECOMPRESSION/TRANSPOSITION;  Surgeon: Dawley, Alan Mulder, DO;  Location: [facility] OR;  Service: Neurosurgery;  Laterality: Left;   Patient Active Problem List   Diagnosis Date Noted   Dizziness 01/20/2024   Chronic eczematous otitis externa of both ears 01/20/2024   Postnasal drip 01/20/2024   Cervical radiculopathy 12/08/2023   Cervical stenosis of spine 04/04/2023   Ulnar neuropathy at elbow, right 04/04/2023   DJD (degenerative joint disease) of cervical spine 04/04/2023    Myalgia due to statin 08/12/2020   Umbilical hernia without obstruction and without gangrene 06/18/2019   Globus pharyngeus 05/25/2019   OSA on CPAP 01/17/2018   Chronic rhinitis 01/17/2018   Hypertrophy of nasal turbinates 01/17/2018   Hyperplastic colon polyp 12/28/2017   Refusal of statin medication by patient 12/27/2017   Constipation 06/15/2017   Dyspepsia 06/13/2017   BPH with urinary obstruction 03/28/2017   Gastroesophageal reflux disease without esophagitis 03/28/2017   Familial hyperlipidemia 03/28/2017   Primary osteoarthritis involving multiple joints 03/28/2017   Seasonal allergic rhinitis due to pollen 03/28/2017   Popliteus tendinitis of right lower extremity 10/21/2015   Tendinitis of both rotator cuffs 10/21/2015    PCP: Asencion Partridge, MD REFERRING PROVIDER: Newman Pies, MD  REFERRING DIAG: R42 (ICD-10-CM) - Dizziness  THERAPY DIAG:  Dizziness and giddiness - Plan: PT plan of care cert/re-cert  Unsteadiness on feet - Plan: PT plan of care cert/re-cert  ONSET DATE: 02/28/24 referral  Rationale for Evaluation and Treatment: Rehabilitation  SUBJECTIVE:   SUBJECTIVE STATEMENT: Patient arrives to clinic alone, no AD. Many years ago had a bad bout of vertigo and ever since then he's had dizziness, instability and "white noise" in his ears. He reports he's "adapted" to it. He reports that when he sees movement he feels unsteady. This has been going on for ~35 years. Per patient, ENT did not provide vestibular dx. Per patient, many years ago  he had the "cold water" test at the ENT and that was positive and "sent him spinning." Pt accompanied by: self  PERTINENT HISTORY: GERD, HLD, OSA, ACDF C6-T1  PAIN:  Are you having pain? No  PRECAUTIONS: Fall   WEIGHT BEARING RESTRICTIONS: No  FALLS: Has patient fallen in last 6 months? No  LIVING ENVIRONMENT: Lives with: lives with their family Lives in: House/apartment Stairs: Yes: Internal: flight steps; on right going  up and External: 3 steps; on right going up Has following equipment at home: None  PLOF: Independent  PATIENT GOALS: to not be dizzy  OBJECTIVE:  Note: Objective measures were completed at Evaluation unless otherwise noted.  DIAGNOSTIC FINDINGS: non contributory   COGNITION: Overall cognitive status: Within functional limits for tasks assessed   SENSATION: Light touch: Impaired  L UE d/t neck dx   POSTURE:  No Significant postural limitations  Cervical ROM:   Fused C6-T1  STRENGTH: WFL  BED MOBILITY:  Independent, dizziness with quick bed mobility   PATIENT SURVEYS:  DHI 16- mild handicap  VESTIBULAR ASSESSMENT:  GENERAL OBSERVATION: NAD, no AD   SYMPTOM BEHAVIOR:  Subjective history: see above  Non-Vestibular symptoms: changes in hearing and migraine symptoms "white noise" and photo/phonosensitivity   Type of dizziness: Imbalance (Disequilibrium) and Spinning/Vertigo (35 years ago)  Frequency: constant  Duration: constant  Aggravating factors: Worse in the morning and Worse outside or in busy environment  Relieving factors: slow movements and avoid busy/distracting environments  Progression of symptoms: unchanged  OCULOMOTOR EXAM:  Ocular Alignment: normal tri-focals received in December (dizziness got worse initially)  Ocular ROM: No Limitations  Spontaneous Nystagmus: absent  Gaze-Induced Nystagmus: absent  Smooth Pursuits: intact and reported slight increase in dizziness  Saccades: intact  Convergence/Divergence: <6 cm   VESTIBULAR - OCULAR REFLEX:   Slow VOR: Normal  VOR Cancellation: Normal  Head-Impulse Test: HIT Right: negative HIT Left: negative   POSITIONAL TESTING: Right Roll Test: no nystagmus Left Roll Test: no nystagmus Right Sidelying: no nystagmus Left Sidelying: no nystagmus  MOTION SENSITIVITY:  Motion Sensitivity Quotient Intensity: 0 = none, 1 = Lightheaded, 2 = Mild, 3 = Moderate, 4 = Severe, 5 = Vomiting  Intensity  1.  Sitting to supine 0  2. Supine to L side 0  3. Supine to R side 0  4. Supine to sitting 1  5. L sidelying 0  6. Up from L  1  7. R sidelying 0  8. Up from R  1  9. Sitting, head tipped to L knee 0  10. Head up from L knee 0  11. Sitting, head tipped to R knee 0  12. Head up from R knee 0  13. Sitting head turns x5 1  14.Sitting head nods x5 0  15. In stance, 180 turn to L  0  16. In stance, 180 turn to R 0    FUNCTIONAL GAIT: MCTSIB: Condition 1: Avg of 3 trials: 30 sec, Condition 2: Avg of 3 trials: 30 sec, Condition 3: Avg of 3 trials: 30 sec, Condition 4: Avg of 3 trials: 30 sec, and Total Score: 120/120  TREATMENT  Theract: -ddx related to patient symptom presentation  -binasal occlusion  -initial HEP (see below)   PATIENT EDUCATION: Education details: see above, PT POC, exam findings, initial HEP  Person educated: Patient Education method: Explanation, Demonstration, and Handouts Education comprehension: verbalized understanding and needs further education  HOME EXERCISE PROGRAM: Gaze Stabilization: Sitting    Keeping eyes on target on wall 54ft feet away, tilt head down 15-30 and move head side to side for __30__ seconds. Repeat while moving head up and down for __30__ seconds. Do __3__ sessions per day. Repeat using target on pattern background.  [email or web address]  GOALS: Goals reviewed with patient? Yes  SHORT TERM GOALS: Target date: 03/30/24  Pt will be independent with initial HEP for improved balance  Baseline: to be updated Goal status: INITIAL  2.  FGA to be assessed and LTG written Baseline:  Goal status: INITIAL   LONG TERM GOALS: Target date: 04/27/24  Pt will be independent with final HEP for improved balance  Baseline: to be updated Goal status:  INITIAL  2.  FGA goal Baseline: to be assessed Goal status: INITIAL  3.  Patient will improve DHI score to </= to 12 to demonstrate an improvement in symptoms Baseline: 16 Goal status: INITIAL  ASSESSMENT:  CLINICAL IMPRESSION: Patient is a 73 y.o. male who was seen today for physical therapy evaluation and treatment for dizziness. He has a 30+ year history of dizziness that began with a severe episode of spinning vertigo. Per patient, he had a positive cold water and rotary chair test at the time. Today, he presents with an essentially normal vestibular exam- normal VOR, VOR-C, HIT, convergence, age-appropriate saccades, no nystagmus and negative positional testing. Despite his history of neuropathy, he scored perfectly on the MCTSIB. He does report mild motion sensitivity and PT questions dorsal stream dysfunction as it relates to his vision. He reports bumping into objects that he typically wouldn't, such as in his own pantry, and not being able to tolerate viewing other fast moving objects without then feeling as though he's moving. His neuropathy + slower (though age-appropriate) saccades + history of some vestibular event certainly could contribute to poor spatial mapping and therefore, a loss of balance with movement. He would benefit from skilled PT services to address the above mentioned deficits.   OBJECTIVE IMPAIRMENTS: dizziness.   ACTIVITY LIMITATIONS: locomotion level and caring for others  PARTICIPATION LIMITATIONS: interpersonal relationship, driving, shopping, community activity, and yard work  PERSONAL FACTORS: Age, Fitness, Past/current experiences, and Time since onset of injury/illness/exacerbation are also affecting patient's functional outcome.   REHAB POTENTIAL: Fair unknown etiology; chronicity  CLINICAL DECISION MAKING: Unstable/unpredictable  EVALUATION COMPLEXITY: High   PLAN:  PT FREQUENCY: 1x/week per patient request  PT DURATION: 6 weeks  PLANNED  INTERVENTIONS: 97164- PT Re-evaluation, 97110-Therapeutic exercises, 97530- Therapeutic activity, 97112- Neuromuscular re-education, 97535- Self Care, 40981- Manual therapy, [phone]- Gait training, [phone]- Canalith repositioning, [phone]- Aquatic Therapy, Patient/Family education, Balance training, Stair training, Vestibular training, Visual/preceptual remediation/compensation, Cognitive remediation, and DME instructions  PLAN FOR NEXT SESSION: FGA + goal, optokinetics, vision exercises, marsden ball? How did the binasal occlusion go?   Westley Foots, PT Westley Foots, PT, DPT, CBIS  03/01/2024, 10:19 AM

## 2024-03-05 DIAGNOSIS — R3915 Urgency of urination: Secondary | ICD-10-CM | POA: Diagnosis not present

## 2024-03-05 DIAGNOSIS — G4733 Obstructive sleep apnea (adult) (pediatric): Secondary | ICD-10-CM | POA: Diagnosis not present

## 2024-03-05 DIAGNOSIS — R351 Nocturia: Secondary | ICD-10-CM | POA: Diagnosis not present

## 2024-03-05 DIAGNOSIS — N401 Enlarged prostate with lower urinary tract symptoms: Secondary | ICD-10-CM | POA: Diagnosis not present

## 2024-03-05 DIAGNOSIS — R972 Elevated prostate specific antigen [PSA]: Secondary | ICD-10-CM | POA: Diagnosis not present

## 2024-03-06 DIAGNOSIS — M25519 Pain in unspecified shoulder: Secondary | ICD-10-CM | POA: Diagnosis not present

## 2024-03-06 DIAGNOSIS — M4322 Fusion of spine, cervical region: Secondary | ICD-10-CM | POA: Diagnosis not present

## 2024-03-08 ENCOUNTER — Encounter: Payer: Self-pay | Admitting: Physical Therapy

## 2024-03-08 ENCOUNTER — Ambulatory Visit: Admitting: Physical Therapy

## 2024-03-08 DIAGNOSIS — R2681 Unsteadiness on feet: Secondary | ICD-10-CM

## 2024-03-08 DIAGNOSIS — R42 Dizziness and giddiness: Secondary | ICD-10-CM | POA: Diagnosis not present

## 2024-03-08 NOTE — Therapy (Signed)
 OUTPATIENT PHYSICAL THERAPY VESTIBULAR TREATMENT NOTE     Patient Name: AZUL BRUMETT MRN: [id number] DOB:[birth year], 73 y.o., male Today's Date: 03/08/2024  END OF SESSION:  PT End of Session - 03/08/24 0834     Visit Number 2    Number of Visits 7    Date for PT Re-Evaluation 04/27/24   to allow for delay in scheduling   Authorization Type [employer] medicare    PT Start Time 0800    PT Stop Time 0831   pt requested to end session early due to going out of town   PT Time Calculation (min) 31 min    Activity Tolerance Patient tolerated treatment well    Behavior During Therapy [facility] for tasks assessed/performed              Past Medical History:  Diagnosis Date   Allergy    Colon polyp    GERD (gastroesophageal reflux disease)    Hypercholesterolemia    Hyperplastic colon polyp 12/28/2017   Colonoscopy 2018, Dr. Rhetta Mura, repeat 10 years.   Primary osteoarthritis involving multiple joints    Seasonal allergic rhinitis due to pollen    Sleep apnea    Past Surgical History:  Procedure Laterality Date   ANTERIOR CERVICAL DECOMP/DISCECTOMY FUSION N/A 12/08/2023   Procedure: CERVICAL SIX-SEVEN, CERVICAL SEVEN-THORACIC ONE ANTERIOR CERIVCAL DECOMPRESSION/DISCECTOMY FUSION;  Surgeon: Dawley, Alan Mulder, DO;  Location: [facility] OR;  Service: Neurosurgery;  Laterality: N/A;   APPENDECTOMY     colonoscopy   06/2005   MEDIAL COLLATERAL LIGAMENT REPAIR, KNEE Right    football injury   TONSILLECTOMY AND ADENOIDECTOMY  1962   ULNAR NERVE TRANSPOSITION Left 12/08/2023   Procedure: ULNAR NERVE DECOMPRESSION/TRANSPOSITION;  Surgeon: Dawley, Alan Mulder, DO;  Location: [facility] OR;  Service: Neurosurgery;  Laterality: Left;   Patient Active Problem List   Diagnosis Date Noted   Dizziness 01/20/2024   Chronic eczematous otitis externa of both ears 01/20/2024   Postnasal drip 01/20/2024   Cervical radiculopathy 12/08/2023   Cervical stenosis of spine 04/04/2023   Ulnar neuropathy at elbow, right 04/04/2023    DJD (degenerative joint disease) of cervical spine 04/04/2023   Myalgia due to statin 08/12/2020   Umbilical hernia without obstruction and without gangrene 06/18/2019   Globus pharyngeus 05/25/2019   OSA on CPAP 01/17/2018   Chronic rhinitis 01/17/2018   Hypertrophy of nasal turbinates 01/17/2018   Hyperplastic colon polyp 12/28/2017   Refusal of statin medication by patient 12/27/2017   Constipation 06/15/2017   Dyspepsia 06/13/2017   BPH with urinary obstruction 03/28/2017   Gastroesophageal reflux disease without esophagitis 03/28/2017   Familial hyperlipidemia 03/28/2017   Primary osteoarthritis involving multiple joints 03/28/2017   Seasonal allergic rhinitis due to pollen 03/28/2017   Popliteus tendinitis of right lower extremity 10/21/2015   Tendinitis of both rotator cuffs 10/21/2015    PCP: Asencion Partridge, MD REFERRING PROVIDER: Newman Pies, MD  REFERRING DIAG: R42 (ICD-10-CM) - Dizziness  THERAPY DIAG:  Dizziness and giddiness  Unsteadiness on feet  ONSET DATE: 02/28/24 referral  Rationale for Evaluation and Treatment: Rehabilitation  SUBJECTIVE:   SUBJECTIVE STATEMENT: Pt reports he thinks the binasal occlusion helped - left the tape on for about a day, says it was a little hard to get the glue from the tape off of his glasses.  Pt reports he has fuzzy sensation in the back of his head and a white noise in his ears but is functional. Pt reports he could live with the way it  is now but thought he would try therapy to see if it could be improved. Denies balance problems.  Pt accompanied by: self  PERTINENT HISTORY: GERD, HLD, OSA, ACDF C6-T1  PAIN:  Are you having pain? No  PRECAUTIONS: Fall   WEIGHT BEARING RESTRICTIONS: No  FALLS: Has patient fallen in last 6 months? No  LIVING ENVIRONMENT: Lives with: lives with their family Lives in: House/apartment Stairs: Yes: Internal: flight steps; on right going up and External: 3 steps; on right going up Has  following equipment at home: None  PLOF: Independent  PATIENT GOALS: to not be dizzy  OBJECTIVE:  Note: Objective measures were completed at Evaluation unless otherwise noted.  DIAGNOSTIC FINDINGS: non contributory   COGNITION: Overall cognitive status: Within functional limits for tasks assessed   SENSATION: Light touch: Impaired  L UE d/t neck dx   POSTURE:  No Significant postural limitations  Cervical ROM:   Fused C6-T1  STRENGTH: WFL  BED MOBILITY:  Independent, dizziness with quick bed mobility   PATIENT SURVEYS:  DHI 16- mild handicap  VESTIBULAR ASSESSMENT:  GENERAL OBSERVATION: NAD, no AD   SYMPTOM BEHAVIOR:  Subjective history: see above  Non-Vestibular symptoms: changes in hearing and migraine symptoms "white noise" and photo/phonosensitivity   Type of dizziness: Imbalance (Disequilibrium) and Spinning/Vertigo (35 years ago)  Frequency: constant  Duration: constant  Aggravating factors: Worse in the morning and Worse outside or in busy environment  Relieving factors: slow movements and avoid busy/distracting environments  Progression of symptoms: unchanged  OCULOMOTOR EXAM:  Ocular Alignment: normal tri-focals received in December (dizziness got worse initially)  Ocular ROM: No Limitations  Spontaneous Nystagmus: absent  Gaze-Induced Nystagmus: absent  Smooth Pursuits: intact and reported slight increase in dizziness  Saccades: intact  Convergence/Divergence: <6 cm   VESTIBULAR - OCULAR REFLEX:   Slow VOR: Normal  VOR Cancellation: Normal  Head-Impulse Test: HIT Right: negative HIT Left: negative   POSITIONAL TESTING: Right Roll Test: no nystagmus Left Roll Test: no nystagmus Right Sidelying: no nystagmus Left Sidelying: no nystagmus  MOTION SENSITIVITY:  Motion Sensitivity Quotient Intensity: 0 = none, 1 = Lightheaded, 2 = Mild, 3 = Moderate, 4 = Severe, 5 = Vomiting  Intensity  1. Sitting to supine 0  2. Supine to L side 0  3.  Supine to R side 0  4. Supine to sitting 1  5. L sidelying 0  6. Up from L  1  7. R sidelying 0  8. Up from R  1  9. Sitting, head tipped to L knee 0  10. Head up from L knee 0  11. Sitting, head tipped to R knee 0  12. Head up from R knee 0  13. Sitting head turns x5 1  14.Sitting head nods x5 0  15. In stance, 180 turn to L  0  16. In stance, 180 turn to R 0    FUNCTIONAL GAIT: MCTSIB: Condition 1: Avg of 3 trials: 30 sec, Condition 2: Avg of 3 trials: 30 sec, Condition 3: Avg of 3 trials: 30 sec, Condition 4: Avg of 3 trials: 30 sec, and Total Score: 120/120  TREATMENT  03-08-24  Gait:   [facility] PT Assessment - 03/08/24 0001       Functional Gait  Assessment   Gait assessed  Yes    Gait Level Surface Walks 20 ft in less than 5.5 sec, no assistive devices, good speed, no evidence for imbalance, normal gait pattern, deviates no more than 6 in outside of the 12 in walkway width.    Change in Gait Speed Able to smoothly change walking speed without loss of balance or gait deviation. Deviate no more than 6 in outside of the 12 in walkway width.    Gait with Horizontal Head Turns Performs head turns smoothly with slight change in gait velocity (eg, minor disruption to smooth gait path), deviates 6-10 in outside 12 in walkway width, or uses an assistive device.    Gait with Vertical Head Turns Performs head turns with no change in gait. Deviates no more than 6 in outside 12 in walkway width.    Gait and Pivot Turn Pivot turns safely within 3 sec and stops quickly with no loss of balance.    Step Over Obstacle Is able to step over 2 stacked shoe boxes taped together (9 in total height) without changing gait speed. No evidence of imbalance.    Gait with Narrow Base of Support Is able to ambulate for 10 steps heel to toe with no staggering.    Gait with Eyes Closed  Walks 20 ft, no assistive devices, good speed, no evidence of imbalance, normal gait pattern, deviates no more than 6 in outside 12 in walkway width. Ambulates 20 ft in less than 7 sec.    Ambulating Backwards Walks 20 ft, no assistive devices, good speed, no evidence for imbalance, normal gait    Steps Alternating feet, no rail.    Total Score 29              NeuroRe-ed:  SVA - line 8:  DVA line 5 on initial test; pt requested to try again due to "still getting used to transitional lenses":  line 6 on 2nd test - pt had no significant c/o increased dizziness upon completion of testing  X1 viewing - performed in standing- target 4' away on busy background (posture grid); 30 secs horizontal x 1 rep; 30 secs vertical x 1 rep (no significant difference reported with either direction)  Pt stood on [employer] - performed tracking ball with eye/head movement - focusing on letters/word on busy patterned ball - 10 reps CW and 10 reps CCW - no c/o dizziness upon completion of exercise  PATIENT EDUCATION: Education details: progressed gaze stabilization exercise to pattern background and to standing position - increased time from 30 secs to 60 secs for HEP; also informed pt that optikinetic videos recommended to challenge visual/vestibular function Person educated: Patient Education method: Explanation, Demonstration, and Handouts Education comprehension: verbalized understanding and needs further education  HOME EXERCISE PROGRAM: Gaze Stabilization: Sitting    Keeping eyes on target on wall 11ft feet away, tilt head down 15-30 and move head side to side for __30__ seconds. Repeat while moving head up and down for __30__ seconds. Do __3__ sessions per day. Repeat using target on pattern background.  [email or web address]  GOALS: Goals reviewed with patient? Yes  SHORT TERM GOALS: Target date: 03/30/24  Pt will be independent with initial  HEP for improved balance  Baseline: to be updated Goal status: INITIAL  2.  FGA to be assessed and LTG written Baseline: 29/30 on 03-08-24 Goal status:  Goal met  LONG TERM GOALS: Target date: 04/27/24  Pt will be independent with final HEP for improved balance  Baseline: to be updated Goal status: INITIAL  2.  FGA goal Baseline: score 29/30 on 03-08-24 Goal status: Deferred due to score 29/30  3.  Patient will improve DHI score to </= to 12 to demonstrate an improvement in symptoms Baseline: 16 Goal status: INITIAL  ASSESSMENT:  CLINICAL IMPRESSION: PT session was limited in time per pt's request to leave early due to preparation needed for going out of town.  Today's PT session focused on assessment of FGA with pt scoring 29/30 with very mild deviation in path noted with amb. With horizontal head turns.  Pt's DVA WNL's on 2nd test with a 2 line difference; pt had 3 line difference on initial DVA test, however, pt wears transitional lenses and this may be impacting performance/assessment.  Symptoms appear to be related to vision.  Cont with POC.   OBJECTIVE IMPAIRMENTS: dizziness.   ACTIVITY LIMITATIONS: locomotion level and caring for others  PARTICIPATION LIMITATIONS: interpersonal relationship, driving, shopping, community activity, and yard work  PERSONAL FACTORS: Age, Fitness, Past/current experiences, and Time since onset of injury/illness/exacerbation are also affecting patient's functional outcome.   REHAB POTENTIAL: Fair unknown etiology; chronicity  CLINICAL DECISION MAKING: Unstable/unpredictable  EVALUATION COMPLEXITY: High   PLAN:  PT FREQUENCY: 1x/week per patient request  PT DURATION: 6 weeks  PLANNED INTERVENTIONS: 97164- PT Re-evaluation, 97110-Therapeutic exercises, 97530- Therapeutic activity, 97112- Neuromuscular re-education, 97535- Self Care, 56213- Manual therapy, [phone]- Gait training, [phone]- Canalith repositioning, [phone]- Aquatic Therapy,  Patient/Family education, Balance training, Stair training, Vestibular training, Visual/preceptual remediation/compensation, Cognitive remediation, and DME instructions  PLAN FOR NEXT SESSION: SOT: redo binasal occlusion? (Pt reports it was beneficial); optikinetics   Yasmin Dibello, Donavan Burnet, PT   03/08/2024, 8:36 AM

## 2024-03-13 ENCOUNTER — Other Ambulatory Visit: Payer: Self-pay | Admitting: Family Medicine

## 2024-03-13 DIAGNOSIS — M25519 Pain in unspecified shoulder: Secondary | ICD-10-CM | POA: Diagnosis not present

## 2024-03-13 DIAGNOSIS — M4322 Fusion of spine, cervical region: Secondary | ICD-10-CM | POA: Diagnosis not present

## 2024-03-14 ENCOUNTER — Ambulatory Visit

## 2024-03-15 DIAGNOSIS — M25519 Pain in unspecified shoulder: Secondary | ICD-10-CM | POA: Diagnosis not present

## 2024-03-15 DIAGNOSIS — M4322 Fusion of spine, cervical region: Secondary | ICD-10-CM | POA: Diagnosis not present

## 2024-03-28 ENCOUNTER — Ambulatory Visit: Admitting: Physical Therapy

## 2024-04-04 ENCOUNTER — Ambulatory Visit: Payer: Medicare HMO | Admitting: Family Medicine

## 2024-04-04 ENCOUNTER — Encounter: Payer: Self-pay | Admitting: Family Medicine

## 2024-04-04 VITALS — BP 133/79 | HR 66 | Temp 98.2°F | Ht 70.0 in | Wt 193.8 lb

## 2024-04-04 DIAGNOSIS — N138 Other obstructive and reflux uropathy: Secondary | ICD-10-CM

## 2024-04-04 DIAGNOSIS — N401 Enlarged prostate with lower urinary tract symptoms: Secondary | ICD-10-CM

## 2024-04-04 DIAGNOSIS — K219 Gastro-esophageal reflux disease without esophagitis: Secondary | ICD-10-CM

## 2024-04-04 DIAGNOSIS — Z0000 Encounter for general adult medical examination without abnormal findings: Secondary | ICD-10-CM | POA: Diagnosis not present

## 2024-04-04 DIAGNOSIS — R1013 Epigastric pain: Secondary | ICD-10-CM | POA: Diagnosis not present

## 2024-04-04 DIAGNOSIS — J310 Chronic rhinitis: Secondary | ICD-10-CM

## 2024-04-04 DIAGNOSIS — R739 Hyperglycemia, unspecified: Secondary | ICD-10-CM

## 2024-04-04 DIAGNOSIS — M4722 Other spondylosis with radiculopathy, cervical region: Secondary | ICD-10-CM

## 2024-04-04 DIAGNOSIS — M150 Primary generalized (osteo)arthritis: Secondary | ICD-10-CM

## 2024-04-04 DIAGNOSIS — M4802 Spinal stenosis, cervical region: Secondary | ICD-10-CM

## 2024-04-04 DIAGNOSIS — G4733 Obstructive sleep apnea (adult) (pediatric): Secondary | ICD-10-CM

## 2024-04-04 DIAGNOSIS — M791 Myalgia, unspecified site: Secondary | ICD-10-CM

## 2024-04-04 DIAGNOSIS — T466X5A Adverse effect of antihyperlipidemic and antiarteriosclerotic drugs, initial encounter: Secondary | ICD-10-CM

## 2024-04-04 DIAGNOSIS — Z0001 Encounter for general adult medical examination with abnormal findings: Secondary | ICD-10-CM

## 2024-04-04 LAB — POCT GLYCOSYLATED HEMOGLOBIN (HGB A1C): Hemoglobin A1C: 5.6 % (ref 4.0–5.6)

## 2024-04-04 NOTE — Patient Instructions (Signed)
 Please return in 12 months for your annual complete physical; please come fasting.    If you have any questions or concerns, please don't hesitate to send me a message via MyChart or call the office at [phone]. Thank you for visiting with us  today! It's our pleasure caring for you.    VISIT SUMMARY: You had a follow-up visit today to review your overall health and ongoing treatments. Your neck surgery recovery is progressing, though you still have some numbness in your left arm and fingers. Your sleep apnea is well-managed with your CPAP machine, and your nasal surgery has improved your breathing. Your dizziness has improved with physical therapy, and your prostate issues are being managed with medication. Your cholesterol remains high, but you prefer not to take medication for it. Your recent dermatology check-up showed no new concerns.  YOUR PLAN: -WELLNESS VISIT: This was a routine wellness visit with no new concerns. Your immunizations and screenings are up to date. Continue with your current medications and follow up in one year for a complete physical.  -DIZZINESS: Your dizziness has improved with physical therapy. Continue with your sessions as planned.  -ALLERGIC RHINITIS POST-SURGERY: Your symptoms have improved following your nasal surgery. No further treatment is needed at this time.  -ULNAR NERVE DECOMPRESSION: You had surgery to relieve pressure on your ulnar nerve, which is still causing some numbness in your fingers. This may improve over time as the nerve continues to recover.  -SLEEP APNEA: Your sleep apnea is well-managed with your CPAP machine, and you are using it effectively. No changes are needed.  -BENIGN PROSTATIC HYPERPLASIA WITH LOWER URINARY TRACT SYMPTOMS: Your prostate issues are being managed with tamsulosin, and you report only minor symptoms like dribbling. Your PSA levels are low, which is a good sign.  -HYPERLIPIDEMIA: You have high cholesterol but prefer  not to take medication for it. We discussed alternative treatments, but you are not interested in them at this time.  -HYPERGLYCEMIA: your A1c tests your average sugars over the last 3 months. Your levels are at the upper range of normal.   INSTRUCTIONS: Please continue with your current medications and follow up in one year for a complete physical. Keep attending your physical therapy sessions for dizziness. If you experience any new or worsening symptoms, please contact our office.                      Contains text generated by Abridge.                                 Contains text generated by Abridge.

## 2024-04-04 NOTE — Progress Notes (Signed)
 Subjective  Chief Complaint  Patient presents with   Annual Exam    Pt here for Annual Exam and is not currently fasting     HPI: Eugene Mcdaniel is a 73 y.o. male who presents to [facility] [place] today for a Male Wellness Visit. He also has the concerns and/or needs as listed above in the chief complaint. These will be addressed in addition to the Health Maintenance Visit.   Wellness Visit: annual visit with health maintenance review and exam   HM: CRC screen due in 2028, Dr. Jeanetta Milian, had hyperplastic polyp in 2018. Busy year: nasal surgery, cervical spine decompression, ulnar nerve decompression and skin cancer removal. Has recovered well and feels well. Active. Imms up to date.  Reviewed blood work from February No visits with results within 1 Day(s) from this visit.  Latest known visit with results is:  Office Visit on 01/27/2024  Component Date Value Ref Range Status   Sodium 01/27/2024 140  135 - 145 mEq/L Final   Potassium 01/27/2024 4.2  3.5 - 5.1 mEq/L Final   Chloride 01/27/2024 103  96 - 112 mEq/L Final   CO2 01/27/2024 28  19 - 32 mEq/L Final   Glucose, Bld 01/27/2024 105 (H)  70 - 99 mg/dL Final   BUN 40/98/1191 15  6 - 23 mg/dL Final   Creatinine, Ser 01/27/2024 1.15  0.40 - 1.50 mg/dL Final   Total Bilirubin 01/27/2024 0.9  0.2 - 1.2 mg/dL Final   Alkaline Phosphatase 01/27/2024 49  39 - 117 U/L Final   AST 01/27/2024 18  0 - 37 U/L Final   ALT 01/27/2024 21  0 - 53 U/L Final   Total Protein 01/27/2024 7.2  6.0 - 8.3 g/dL Final   Albumin 47/82/9562 4.6  3.5 - 5.2 g/dL Final   GFR 13/06/6577 63.05  >60.00 mL/min Final   Calcium  01/27/2024 9.6  8.4 - 10.5 mg/dL Final   WBC 46/96/2952 3.7 (L)  4.0 - 10.5 K/uL Final   RBC 01/27/2024 4.74  4.22 - 5.81 Mil/uL Final   Hemoglobin 01/27/2024 15.4  13.0 - 17.0 g/dL Final   HCT 84/13/2440 45.9  39.0 - 52.0 % Final   MCV 01/27/2024 96.8  78.0 - 100.0 fl Final   MCHC 01/27/2024 33.5  30.0 - 36.0 g/dL Final    RDW 09/25/2535 14.2  11.5 - 15.5 % Final   Platelets 01/27/2024 221.0  150.0 - 400.0 K/uL Final   Neutrophils Relative % 01/27/2024 55.4  43.0 - 77.0 % Final   Lymphocytes Relative 01/27/2024 33.3  12.0 - 46.0 % Final   Monocytes Relative 01/27/2024 9.0  3.0 - 12.0 % Final   Eosinophils Relative 01/27/2024 1.9  0.0 - 5.0 % Final   Basophils Relative 01/27/2024 0.4  0.0 - 3.0 % Final   Neutro Abs 01/27/2024 2.1  1.4 - 7.7 K/uL Final   Lymphs Abs 01/27/2024 1.2  0.7 - 4.0 K/uL Final   Monocytes Absolute 01/27/2024 0.3  0.1 - 1.0 K/uL Final   Eosinophils Absolute 01/27/2024 0.1  0.0 - 0.7 K/uL Final   Basophils Absolute 01/27/2024 0.0  0.0 - 0.1 K/uL Final   Iron 01/27/2024 94  42 - 165 ug/dL Final   Transferrin 64/40/3474 238.0  212.0 - 360.0 mg/dL Final   Saturation Ratios 01/27/2024 28.2  20.0 - 50.0 % Final   TIBC 01/27/2024 333.2  250.0 - 450.0 mcg/dL Final   Ferritin 25/95/6387 139.8  22.0 - 322.0  ng/mL Final   VITD 01/27/2024 43.69  30.00 - 100.00 ng/mL Final   Vitamin B-12 01/27/2024 422  211 - 911 pg/mL Final   D-Dimer, Quant 01/27/2024 0.53 (H)  <0.50 mcg/mL FEU Final     Body mass index is 27.81 kg/m. Wt Readings from Last 3 Encounters:  04/04/24 193 lb 12.8 oz (87.9 kg)  02/06/24 190 lb (86.2 kg)  01/27/24 195 lb 3.2 oz (88.5 kg)   Chronic disease management visit and/or acute problem visit: Discussed the use of AI scribe software for clinical note transcription with the patient, who gave verbal consent to proceed.  History of Present Illness Eugene Mcdaniel "Eugene Mcdaniel" is a 73 year old male who presents for a follow-up chronic problem visit.  He underwent neck surgery on January 9th for left arm numbness and ulnar nerve decompression. The numbness in his left arm persists but has not worsened. He also experiences numbness in two fingers.  He has a history of sleep apnea and uses a CPAP machine effectively. He underwent nasal surgery to reduce his turbinates and bone,  which improved his nasal breathing and eliminated the need for Afrin.  He attends physical therapy for dizziness; I reviewed Dr. Pearson Bounds notes and plan, likely vestibular vertigo, which has improved over the past few months. He missed a session last week due to a cold but plans to continue.  He is on Flomax (tamsulosin) for prostate hypertrophy. Sees Dr. Inga Manges. Reports PSA went down this year; was mildly elevated last year.  issues and reports manageable urination with some dribbling. He has regular follow-ups with a urologist.  He has high cholesterol but prefers not to take statins or other medications for it.  He had a skin lesion removed by a dermatologist in the past year and continues to have regular skin checks.  No chest pain, palpitations, or calf pain during activities like walking or golfing. He is up to date with his immunizations and eye exams, and his last colonoscopy was several years ago, with the next one due in a couple of years.   Assessment  1. Encounter for well adult exam with abnormal findings   2. Cervical stenosis of spine   3. Dyspepsia   4. OSA on CPAP   5. Gastroesophageal reflux disease without esophagitis   6. Myalgia due to statin   7. BPH with urinary obstruction   8. Osteoarthritis of spine with radiculopathy, cervical region   9. Primary osteoarthritis involving multiple joints   10. Chronic rhinitis   11. Hyperglycemia      Plan  Male Wellness Visit: Age appropriate Health Maintenance and Prevention measures were discussed with patient. Included topics are cancer screening recommendations, ways to keep healthy (see AVS) including dietary and exercise recommendations, regular eye and dental care, use of seat belts, and avoidance of moderate alcohol  use and tobacco use.  BMI: discussed patient's BMI and encouraged positive lifestyle modifications to help get to or maintain a target BMI. HM needs and immunizations were addressed and ordered. See below for  orders. See HM and immunization section for updates. Routine labs and screening tests ordered including cmp, cbc and lipids where appropriate. Discussed recommendations regarding Vit D and calcium  supplementation (see AVS)  Chronic disease f/u and/or acute problem visit: (deemed necessary to be done in addition to the wellness visit): Assessment and Plan Assessment & Plan Wellness Visit Routine wellness visit with no acute concerns. Immunizations and screenings are current. - Continue current medications. - Follow up in  one year for complete physical.  Dizziness; vestibular vertigo Dizziness improved with physical therapy.  Allergic rhinitis post-surgery Improvement in symptoms post-turbinate reduction surgery. Continues on allergy meds daily. Helpful. No more globus sensations.  Ulnar nerve decompression Post-surgical status with persistent numbness in two fingers. No worsening of symptoms. Nerve recovery may improve over time.  Sleep apnea Well-managed with CPAP therapy. Reports compliance and effectiveness.  Benign prostatic hyperplasia with lower urinary tract symptoms BPH symptoms managed with tamsulosin. Reports dribbling but no significant urinary issues. PSA levels are low.  Hyperlipidemia Prefers not to take statins or other medications. Discussed alternative treatments, but not interested.  Hyperglycemia: A1c:  Lab Results  Component Value Date   HGBA1C 5.6 04/04/2024    GERD, chronic on chronic omeprazole  and well controlled.    Follow up: 12 mo for cpe Commons side effects, risks, benefits, and alternatives for medications and treatment plan prescribed today were discussed, and the patient expressed understanding of the given instructions. Patient is instructed to call or message via MyChart if he/she has any questions or concerns regarding our treatment plan. No barriers to understanding were identified. We discussed Red Flag symptoms and signs in detail. Patient  expressed understanding regarding what to do in case of urgent or emergency type symptoms.  Medication list was reconciled, printed and provided to the patient in AVS. Patient instructions and summary information was reviewed with the patient as documented in the AVS. This note was prepared with assistance of Dragon voice recognition software. Occasional wrong-word or sound-a-like substitutions may have occurred due to the inherent limitations of voice recognition software  Orders Placed This Encounter  Procedures   POCT glycosylated hemoglobin (Hb A1C)   No orders of the defined types were placed in this encounter.    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/28/2017   Ulnar neuropathy at elbow, right 04/04/2023   DJD (degenerative joint disease) of cervical spine 04/04/2023   Myalgia due to statin 08/12/2020   Hyperplastic colon polyp 12/28/2017   Constipation 06/15/2017   BPH with urinary obstruction 03/28/2017   Primary osteoarthritis involving multiple joints 03/28/2017   Umbilical hernia without obstruction and without gangrene 06/18/2019   Chronic rhinitis 01/17/2018   Seasonal allergic rhinitis due to pollen 03/28/2017   Tendinitis of both rotator cuffs 10/21/2015   Dizziness 01/20/2024   Chronic eczematous otitis externa of both ears 01/20/2024   Postnasal drip 01/20/2024   Cervical radiculopathy 12/08/2023   Globus pharyngeus 05/25/2019   Hypertrophy of nasal turbinates 01/17/2018   Popliteus tendinitis of right lower extremity 10/21/2015   Health Maintenance  Topic Date Due   Medicare Annual Wellness (AWV)  Never done   Colonoscopy  09/10/2027   DTaP/Tdap/Td (3 - Td or Tdap) 08/29/2032   Pneumonia Vaccine 13+ Years old   Completed   Hepatitis C Screening  Completed   Zoster Vaccines- Shingrix  Completed   HPV VACCINES  Aged Out   Meningococcal B Vaccine  Aged Out   INFLUENZA VACCINE  Discontinued   COVID-19 Vaccine  Discontinued   Immunization History  Administered Date(s) Administered   Moderna Sars-Covid-2 Vaccination 02/09/2020, 03/11/2020   PNEUMOCOCCAL CONJUGATE-20 11/25/2021   Pneumococcal Polysaccharide-23 08/12/2020   Tdap 02/28/2016, 08/29/2022   Zoster Recombinant(Shingrix) 05/19/2018, 07/20/2018, 08/07/2018  We updated and reviewed the patient's past history in detail and it is documented below. Allergies: Patient is allergic to atorvastatin and simvastatin. Past Medical History  has a past medical history of Allergy, Colon polyp, GERD (gastroesophageal reflux disease), Hypercholesterolemia, Hyperplastic colon polyp (12/28/2017), Primary osteoarthritis involving multiple joints, Seasonal allergic rhinitis due to pollen, and Sleep apnea. Past Surgical History Patient  has a past surgical history that includes Appendectomy; colonoscopy  (06/2005); Tonsillectomy and adenoidectomy (1962); Medial collateral ligament repair, knee (Right); Anterior cervical decomp/discectomy fusion (N/A, 12/08/2023); and Ulnar nerve transposition (Left, 12/08/2023). Social History Patient  reports that he quit smoking about 37 years ago. His smoking use included cigarettes. He started smoking about 67 years ago. He has a 60 pack-year smoking history. He has quit using smokeless tobacco. He reports current alcohol  use. He reports that he does not use drugs. Family History family history includes Arthritis in his father; Healthy in his daughter and sister; Heart disease in his father; Lung cancer in his mother. Review of Systems: Constitutional: negative for fever or malaise Ophthalmic: negative for photophobia, double vision or loss of vision Cardiovascular: negative for chest pain, dyspnea on exertion, or new LE  swelling Respiratory: negative for SOB or persistent cough Gastrointestinal: negative for abdominal pain, change in bowel habits or melena Genitourinary: negative for dysuria or gross hematuria Musculoskeletal: negative for new gait disturbance or muscular weakness Integumentary: negative for new or persistent rashes Neurological: negative for TIA or stroke symptoms Psychiatric: negative for SI or delusions Allergic/Immunologic: negative for hives  Patient Care Team    Relationship Specialty Notifications Start End  Luevenia Saha, MD PCP - General Family Medicine  12/27/17   Homero Luster, MD Attending Physician Urology  12/27/17   Farrel Hones, MD Consulting Physician Gastroenterology  12/28/17   Alejos Amsterdam, MD (Inactive) Attending Physician Pulmonary Disease  06/18/19   Ammon Bales, MD (Inactive) Consulting Physician Otolaryngology  06/18/19   Arthur Billing, MD Referring Physician Family Medicine  08/12/20   Gaynelle Keeling, MD Consulting Physician Dermatology  08/12/20   Corena Devon, MD Consulting Physician Neurosurgery  04/04/23   Reynold Caves, MD Consulting Physician Otolaryngology  04/04/24    Objective  Vitals: BP 133/79   Pulse 66   Temp 98.2 F (36.8 C)   Ht 5\' 10"  (1.778 m)   Wt 193 lb 12.8 oz (87.9 kg)   SpO2 96%   BMI 27.81 kg/m  General:  Well developed, well nourished, no acute distress  Psych:  Alert and orientedx3,normal mood and affect HEENT:  Normocephalic, atraumatic, non-icteric sclera,  oropharynx is clear without mass or exudate, supple neck without adenopathy, or thyromegaly Cardiovascular:  Normal S1, S2, RRR without gallop, rub or murmur,  Respiratory:  Good breath sounds bilaterally, CTAB with normal respiratory effort Gastrointestinal: normal bowel sounds, soft, non-tender, no noted masses. No HSM MSK: Joints are without erythema or swelling.  Skin:  Warm, no rashes Neurologic:    Mental status is normal.  Gross motor and sensory exams are normal.  Stable gait. No tremor

## 2024-04-05 ENCOUNTER — Ambulatory Visit: Attending: Otolaryngology

## 2024-04-11 ENCOUNTER — Ambulatory Visit: Attending: Otolaryngology

## 2024-04-13 NOTE — Progress Notes (Signed)
 [facility] [address] [place] [address] Phone: [phone] Subjective:   IBryan Caprio, am serving as a scribe for Dr. Ronnell Coins.  I'm seeing this patient by the request  of:  Luevenia Saha, MD  CC: Leg pain follow-up  [id number]  01/27/2024 Patient does have more of a popliteus tendinitis noted.  Discussed with patient that there is some subluxation occurring.  Patient though does have recent surgery as well so we will rule out a deep venous thrombosis with a D-dimer.  If elevated will need to consider the possibility of Doppler ultrasound.  Discussed compression, icing regimen, home exercises and avoiding full range of motion.  Follow-up again in 6 to 8 weeks.  Will send to physical therapy as well.     Updated 04/16/2024 Eugene Mcdaniel is a 73 y.o. male coming in with complaint of R knee and lower leg pain. Knee is feeling about the same. Wants to talk to you about his neuropathy. Wasn't consistent with home exercises. Was doing PT, dry needling helped.       Past Medical History:  Diagnosis Date   Allergy    Colon polyp    GERD (gastroesophageal reflux disease)    Hypercholesterolemia    Hyperplastic colon polyp 12/28/2017   Colonoscopy 2018, Dr. Jeanetta Milian, repeat 10 years.   Primary osteoarthritis involving multiple joints    Seasonal allergic rhinitis due to pollen    Sleep apnea    Past Surgical History:  Procedure Laterality Date   ANTERIOR CERVICAL DECOMP/DISCECTOMY FUSION N/A 12/08/2023   Procedure: CERVICAL SIX-SEVEN, CERVICAL SEVEN-THORACIC ONE ANTERIOR CERIVCAL DECOMPRESSION/DISCECTOMY FUSION;  Surgeon: Dawley, Colby Daub, DO;  Location: [facility] OR;  Service: Neurosurgery;  Laterality: N/A;   APPENDECTOMY     colonoscopy   06/2005   MEDIAL COLLATERAL LIGAMENT REPAIR, KNEE Right    football injury   TONSILLECTOMY AND ADENOIDECTOMY  1962   ULNAR NERVE TRANSPOSITION Left 12/08/2023   Procedure: ULNAR NERVE  DECOMPRESSION/TRANSPOSITION;  Surgeon: Dawley, Colby Daub, DO;  Location: [facility] OR;  Service: Neurosurgery;  Laterality: Left;   Social History   Socioeconomic History   Marital status: Married    Spouse name: Renee   Number of children: 1   Years of education: Not on file   Highest education level: Not on file  Occupational History   Occupation: sales/business owner    Comment: [employer] display sales/retail store displays  Tobacco Use   Smoking status: Former    Current packs/day: 0.00    Average packs/day: 2.0 packs/day for 30.0 years (60.0 ttl pk-yrs)    Types: Cigarettes    Start date: 11/29/1956    Quit date: 11/29/1986    Years since quitting: 37.4   Smokeless tobacco: Former  Building services engineer status: Never Used  Substance and Sexual Activity   Alcohol  use: Yes    Comment: Drinks every night   Drug use: No   Sexual activity: Yes  Other Topics Concern   Not on file  Social History Narrative   Not on file   Social Drivers of Health   Financial Resource Strain: Not on file  Food Insecurity: No Food Insecurity (12/08/2023)   Hunger Vital Sign    Worried About Running Out of Food in the Last Year: Never true    Ran Out of Food in the Last Year: Never true  Transportation Needs: No Transportation Needs (12/08/2023)   PRAPARE - Transportation    Lack of Transportation (  Medical): No    Lack of Transportation (Non-Medical): No  Physical Activity: Not on file  Stress: Not on file  Social Connections: Unknown (12/08/2023)   Social Connection and Isolation Panel [NHANES]    Frequency of Communication with Friends and Family: Patient declined    Frequency of Social Gatherings with Friends and Family: Patient declined    Attends Religious Services: Patient unable to answer    Active Member of Clubs or Organizations: Patient declined    Attends Banker Meetings: Patient declined    Marital Status: Patient declined   Allergies  Allergen Reactions   Atorvastatin Other (See  Comments)    myalgias   Simvastatin Other (See Comments)    myalgias   Family History  Problem Relation Age of Onset   Lung cancer Mother    Arthritis Father    Heart disease Father    Healthy Daughter    Healthy Sister    Diabetes Neg Hx      Current Outpatient Medications (Cardiovascular):    doxazosin  (CARDURA ) 8 MG tablet, TAKE 1 TABLET BY MOUTH  DAILY  Current Outpatient Medications (Respiratory):    fluticasone  (FLONASE ) 50 MCG/ACT nasal spray, Place 1 spray into both nostrils daily.   levocetirizine (XYZAL ) 5 MG tablet, TAKE 1 TABLET BY MOUTH EVERY EVENING. GENERIC EQUIVALENT FOR XYZAL    montelukast  (SINGULAIR ) 10 MG tablet, Take 1 tablet (10 mg total) by mouth at bedtime.    Current Outpatient Medications (Other):    ascorbic acid (VITAMIN C) 500 MG tablet, Take 500 mg by mouth daily.   carboxymethylcellulose (REFRESH PLUS) 0.5 % SOLN, Place 1 drop into both eyes 3 (three) times daily as needed (dry eyes).   Multiple Vitamins-Minerals (MENS MULTIVITAMIN) TABS, Take 1 tablet by mouth daily.   NON FORMULARY, Pt uses a c-pap nightly   Omega-3 Fatty Acids (FISH OIL) 1000 MG CPDR, Take 1,000 mg by mouth daily.   omeprazole  (PRILOSEC ) 20 MG capsule, Take 1 capsule (20 mg total) by mouth daily.   Reviewed prior external information including notes and imaging from  primary care provider As well as notes that were available from care everywhere and other healthcare systems.  Past medical history, social, surgical and family history all reviewed in electronic medical record.  No pertanent information unless stated regarding to the chief complaint.   Review of Systems:  No headache, visual changes, nausea, vomiting, diarrhea, constipation, dizziness, abdominal pain, skin rash, fevers, chills, night sweats, weight loss, swollen lymph nodes, body aches, joint swelling, chest pain, shortness of breath, mood changes. POSITIVE muscle aches  Objective  Blood pressure 116/78,  pulse 64, height 5\' 10"  (1.778 m), weight 195 lb (88.5 kg), SpO2 99%.   General: No apparent distress alert and oriented x3 mood and affect normal, dressed appropriately.  HEENT: Pupils equal, extraocular movements intact  Respiratory: Patient's speak in full sentences and does not appear short of breath  Cardiovascular: No lower extremity edema, non tender, no erythema  Negative straight leg test, good range of motion of the left knee noted.  Mild crepitus.  Neurovascularly intact distally it appears.  Able to get up from a seated position without any significant difficulty.    Impression and Recommendations:    The above documentation has been reviewed and is accurate and complete Alekhya Gravlin M Reed Dady, DO

## 2024-04-16 ENCOUNTER — Other Ambulatory Visit: Payer: Self-pay

## 2024-04-16 ENCOUNTER — Ambulatory Visit: Admitting: Family Medicine

## 2024-04-16 VITALS — BP 116/78 | HR 64 | Ht 70.0 in | Wt 195.0 lb

## 2024-04-16 DIAGNOSIS — E538 Deficiency of other specified B group vitamins: Secondary | ICD-10-CM | POA: Diagnosis not present

## 2024-04-16 DIAGNOSIS — M76891 Other specified enthesopathies of right lower limb, excluding foot: Secondary | ICD-10-CM

## 2024-04-16 DIAGNOSIS — M25561 Pain in right knee: Secondary | ICD-10-CM | POA: Diagnosis not present

## 2024-04-16 MED ORDER — CYANOCOBALAMIN 1000 MCG/ML IJ SOLN
1000.0000 ug | Freq: Once | INTRAMUSCULAR | Status: AC
  Administered 2024-04-16: 1000 ug via INTRAMUSCULAR

## 2024-04-16 NOTE — Patient Instructions (Signed)
 B12 injection today Send ABI report when you get a chance Lets keep wearing the good shoes and monitoring symptoms See you again in 3 months

## 2024-04-17 DIAGNOSIS — E538 Deficiency of other specified B group vitamins: Secondary | ICD-10-CM | POA: Insufficient documentation

## 2024-04-17 NOTE — Assessment & Plan Note (Signed)
 Discussed with patient again.  Has arthritic changes of multiple joints, popliteal tendinitis of the knee noted previously as well.  Had multiple things that are likely contributing to the discomfort and pain that patient is having.  Discussed with patient that at the moment it is not significantly affecting the quality of life and getting more aggressive therapy may not be the right decision at the moment.  Do feel that ABI's would be beneficial and he states he just had this done at another facility and will get us  the results when he gets them.  Rule out neurologic already at this time so we will continue to monitor.  Follow-up again in 3 to 4 months

## 2024-04-17 NOTE — Assessment & Plan Note (Signed)
 Patient is doing supplementation but given an injection today to see if this would resolve some of his symptoms as well.

## 2024-04-18 ENCOUNTER — Encounter

## 2024-05-05 DIAGNOSIS — G4733 Obstructive sleep apnea (adult) (pediatric): Secondary | ICD-10-CM | POA: Diagnosis not present

## 2024-05-06 ENCOUNTER — Encounter: Payer: Self-pay | Admitting: Family Medicine

## 2024-07-17 ENCOUNTER — Ambulatory Visit (INDEPENDENT_AMBULATORY_CARE_PROVIDER_SITE_OTHER): Admitting: Otolaryngology

## 2024-07-20 NOTE — Progress Notes (Deleted)
 [facility] [address] [place] [address] Phone: [phone] Subjective:    I'm seeing this patient by the request  of:  Jodie Lavern CROME, MD  CC:   YEP:Dlagzrupcz  04/16/2024 Discussed with patient again.  Has arthritic changes of multiple joints, popliteal tendinitis of the knee noted previously as well.  Had multiple things that are likely contributing to the discomfort and pain that patient is having.  Discussed with patient that at the moment it is not significantly affecting the quality of life and getting more aggressive therapy may not be the right decision at the moment.  Do feel that ABI's would be beneficial and he states he just had this done at another facility and will get us  the results when he gets them.  Rule out neurologic already at this time so we will continue to monitor.  Follow-up again in 3 to 4 months     Patient is doing supplementation but given an injection today to see if this would resolve some of his symptoms as well.      Update 07/23/2024 Eugene Mcdaniel is a 73 y.o. male coming in with complaint of R knee pain. Patient states     Past Medical History:  Diagnosis Date   Allergy    Colon polyp    GERD (gastroesophageal reflux disease)    Hypercholesterolemia    Hyperplastic colon polyp 12/28/2017   Colonoscopy 2018, Dr. Shana, repeat 10 years.   Primary osteoarthritis involving multiple joints    Seasonal allergic rhinitis due to pollen    Sleep apnea    Past Surgical History:  Procedure Laterality Date   ANTERIOR CERVICAL DECOMP/DISCECTOMY FUSION N/A 12/08/2023   Procedure: CERVICAL SIX-SEVEN, CERVICAL SEVEN-THORACIC ONE ANTERIOR CERIVCAL DECOMPRESSION/DISCECTOMY FUSION;  Surgeon: Dawley, Lani BROCKS, DO;  Location: [facility] OR;  Service: Neurosurgery;  Laterality: N/A;   APPENDECTOMY     colonoscopy   06/2005   MEDIAL COLLATERAL LIGAMENT REPAIR, KNEE Right    football injury   TONSILLECTOMY AND ADENOIDECTOMY  1962    ULNAR NERVE TRANSPOSITION Left 12/08/2023   Procedure: ULNAR NERVE DECOMPRESSION/TRANSPOSITION;  Surgeon: Dawley, Lani BROCKS, DO;  Location: [facility] OR;  Service: Neurosurgery;  Laterality: Left;   Social History   Socioeconomic History   Marital status: Married    Spouse name: Renee   Number of children: 1   Years of education: Not on file   Highest education level: Not on file  Occupational History   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  Tobacco Use   Smoking status: Former    Current packs/day: 0.00    Average packs/day: 2.0 packs/day for 30.0 years (60.0 ttl pk-yrs)    Types: Cigarettes    Start date: 11/29/1956    Quit date: 11/29/1986    Years since quitting: 37.6   Smokeless tobacco: Former  Building services engineer status: Never Used  Substance and Sexual Activity   Alcohol  use: Yes    Comment: Drinks every night   Drug use: No   Sexual activity: Yes  Other Topics Concern   Not on file  Social History Narrative   Not on file   Social Drivers of Health   Financial Resource Strain: Not on file  Food Insecurity: No Food Insecurity (12/08/2023)   Hunger Vital Sign    Worried About Running Out of Food in the Last Year: Never true    Ran Out of Food in the  Last Year: Never true  Transportation Needs: No Transportation Needs (12/08/2023)   PRAPARE - Administrator, Civil Service (Medical): No    Lack of Transportation (Non-Medical): No  Physical Activity: Not on file  Stress: Not on file  Social Connections: Unknown (12/08/2023)   Social Connection and Isolation Panel    Frequency of Communication with Friends and Family: Patient declined    Frequency of Social Gatherings with Friends and Family: Patient declined    Attends Religious Services: Patient unable to answer    Active Member of Clubs or Organizations: Patient declined    Attends Banker Meetings: Patient declined    Marital Status: Patient declined    Allergies  Allergen Reactions   Atorvastatin Other (See Comments)    myalgias   Simvastatin Other (See Comments)    myalgias   Family History  Problem Relation Age of Onset   Lung cancer Mother    Arthritis Father    Heart disease Father    Healthy Daughter    Healthy Sister    Diabetes Neg Hx      Current Outpatient Medications (Cardiovascular):    doxazosin  (CARDURA ) 8 MG tablet, TAKE 1 TABLET BY MOUTH  DAILY  Current Outpatient Medications (Respiratory):    fluticasone  (FLONASE ) 50 MCG/ACT nasal spray, Place 1 spray into both nostrils daily.   levocetirizine (XYZAL ) 5 MG tablet, TAKE 1 TABLET BY MOUTH EVERY EVENING. GENERIC EQUIVALENT FOR XYZAL    montelukast  (SINGULAIR ) 10 MG tablet, Take 1 tablet (10 mg total) by mouth at bedtime.    Current Outpatient Medications (Other):    ascorbic acid (VITAMIN C) 500 MG tablet, Take 500 mg by mouth daily.   carboxymethylcellulose (REFRESH PLUS) 0.5 % SOLN, Place 1 drop into both eyes 3 (three) times daily as needed (dry eyes).   Multiple Vitamins-Minerals (MENS MULTIVITAMIN) TABS, Take 1 tablet by mouth daily.   NON FORMULARY, Pt uses a c-pap nightly   Omega-3 Fatty Acids (FISH OIL) 1000 MG CPDR, Take 1,000 mg by mouth daily.   omeprazole  (PRILOSEC ) 20 MG capsule, Take 1 capsule (20 mg total) by mouth daily.   Reviewed prior external information including notes and imaging from  primary care provider As well as notes that were available from care everywhere and other healthcare systems.  Past medical history, social, surgical and family history all reviewed in electronic medical record.  No pertanent information unless stated regarding to the chief complaint.   Review of Systems:  No headache, visual changes, nausea, vomiting, diarrhea, constipation, dizziness, abdominal pain, skin rash, fevers, chills, night sweats, weight loss, swollen lymph nodes, body aches, joint swelling, chest pain, shortness of breath, mood changes.  POSITIVE muscle aches  Objective  There were no vitals taken for this visit.   General: No apparent distress alert and oriented x3 mood and affect normal, dressed appropriately.  HEENT: Pupils equal, extraocular movements intact  Respiratory: Patient's speak in full sentences and does not appear short of breath  Cardiovascular: No lower extremity edema, non tender, no erythema      Impression and Recommendations:

## 2024-07-23 ENCOUNTER — Ambulatory Visit: Admitting: Family Medicine

## 2024-08-07 DIAGNOSIS — M722 Plantar fascial fibromatosis: Secondary | ICD-10-CM | POA: Diagnosis not present

## 2024-08-07 DIAGNOSIS — R2689 Other abnormalities of gait and mobility: Secondary | ICD-10-CM | POA: Diagnosis not present

## 2024-08-07 DIAGNOSIS — M25571 Pain in right ankle and joints of right foot: Secondary | ICD-10-CM | POA: Diagnosis not present

## 2024-08-07 DIAGNOSIS — R531 Weakness: Secondary | ICD-10-CM | POA: Diagnosis not present

## 2024-08-09 DIAGNOSIS — M25571 Pain in right ankle and joints of right foot: Secondary | ICD-10-CM | POA: Diagnosis not present

## 2024-08-09 DIAGNOSIS — R531 Weakness: Secondary | ICD-10-CM | POA: Diagnosis not present

## 2024-08-09 DIAGNOSIS — R2689 Other abnormalities of gait and mobility: Secondary | ICD-10-CM | POA: Diagnosis not present

## 2024-08-09 DIAGNOSIS — M722 Plantar fascial fibromatosis: Secondary | ICD-10-CM | POA: Diagnosis not present

## 2024-08-14 DIAGNOSIS — M25571 Pain in right ankle and joints of right foot: Secondary | ICD-10-CM | POA: Diagnosis not present

## 2024-08-14 DIAGNOSIS — R531 Weakness: Secondary | ICD-10-CM | POA: Diagnosis not present

## 2024-08-14 DIAGNOSIS — M722 Plantar fascial fibromatosis: Secondary | ICD-10-CM | POA: Diagnosis not present

## 2024-08-14 DIAGNOSIS — R2689 Other abnormalities of gait and mobility: Secondary | ICD-10-CM | POA: Diagnosis not present

## 2024-08-24 ENCOUNTER — Encounter (INDEPENDENT_AMBULATORY_CARE_PROVIDER_SITE_OTHER): Payer: Self-pay

## 2024-08-27 ENCOUNTER — Ambulatory Visit (INDEPENDENT_AMBULATORY_CARE_PROVIDER_SITE_OTHER): Admitting: Otolaryngology

## 2024-08-27 ENCOUNTER — Encounter (INDEPENDENT_AMBULATORY_CARE_PROVIDER_SITE_OTHER): Payer: Self-pay | Admitting: Otolaryngology

## 2024-08-27 VITALS — BP 137/83 | HR 60 | Temp 97.8°F | Ht 70.0 in | Wt 185.0 lb

## 2024-08-27 DIAGNOSIS — R42 Dizziness and giddiness: Secondary | ICD-10-CM

## 2024-08-27 DIAGNOSIS — R0981 Nasal congestion: Secondary | ICD-10-CM | POA: Diagnosis not present

## 2024-08-27 DIAGNOSIS — J301 Allergic rhinitis due to pollen: Secondary | ICD-10-CM

## 2024-08-27 DIAGNOSIS — J310 Chronic rhinitis: Secondary | ICD-10-CM

## 2024-08-27 DIAGNOSIS — J343 Hypertrophy of nasal turbinates: Secondary | ICD-10-CM

## 2024-08-28 NOTE — Progress Notes (Signed)
 Patient ID: Eugene Mcdaniel, male   DOB: [birth year], 73 y.o.   MRN: [id number]  Follow-up: Recurrent dizziness, chronic nasal obstruction  HPI: The patient is a 73 year old male who returns today for his follow-up evaluation.  The patient was previously seen for recurrent dizziness and chronic nasal obstruction.  He underwent bilateral turbinate reduction surgery in March 2025.  His dizziness was treated with physical therapy.  The patient returns today reporting and increase in his nasal congestion during the fall season.  His dizziness has decreased with the physical therapy.  Currently he denies any facial pain, fever, otalgia, otorrhea, or vertigo.  Exam: General: Communicates without difficulty, well nourished, no acute distress. Head: Normocephalic, no evidence injury, no tenderness, facial buttresses intact without stepoff. Face/sinus: No tenderness to palpation and percussion. Facial movement is normal and symmetric. Eyes: PERRL, EOMI. No scleral icterus, conjunctivae clear. Neuro: CN II exam reveals vision grossly intact.  No nystagmus at any point of gaze. Ears: Auricles well formed without lesions.  Ear canals are intact without mass or lesion.  No erythema or edema is appreciated.  The TMs are intact without fluid. Nose: External evaluation reveals normal support and skin without lesions.  Dorsum is intact.  Anterior rhinoscopy reveals congested mucosa over anterior aspect of inferior turbinates and intact septum.  No purulence noted. Oral:  Oral cavity and oropharynx are intact, symmetric, without erythema or edema.  Mucosa is moist without lesions. Neck: Full range of motion without pain.  There is no significant lymphadenopathy.  No masses palpable.  Thyroid  bed within normal limits to palpation.  Parotid glands and submandibular glands equal bilaterally without mass.  Trachea is midline. Neuro:  CN 2-12 grossly intact. Vestibular: No nystagmus at any point of gaze. Dix Hallpike negative.    Assessment: 1.  Recurrent dizziness of unknown etiology.  The severity of his dizziness has decreased following physical therapy.  His Dix-Hallpike maneuver is negative. 2.  Chronic/allergic rhinitis, with mild nasal mucosal congestion.  His inferior turbinates are well-healed. 3.  No acute infection is noted today.  Plan: 1.  The physical exam findings are reviewed with the patient. 2.  Continue with balance exercises at home. 3.  Flonase  nasal spray 2 sprays each nostril daily. 4.  Nasal saline irrigation as needed. 5.  The patient is encouraged to call with any questions or concerns.

## 2024-08-31 ENCOUNTER — Ambulatory Visit (INDEPENDENT_AMBULATORY_CARE_PROVIDER_SITE_OTHER): Admitting: Otolaryngology

## 2024-11-16 ENCOUNTER — Other Ambulatory Visit: Payer: Self-pay | Admitting: Family Medicine

## 2024-12-04 ENCOUNTER — Other Ambulatory Visit: Payer: Self-pay | Admitting: Surgery

## 2025-04-08 ENCOUNTER — Encounter: Admitting: Family Medicine
